# Patient Record
Sex: Female | Born: 1965
Health system: Southern US, Community
[De-identification: ages and names within clinical notes are randomized; demographics above are authoritative.]

## PROBLEM LIST (undated history)

## (undated) HISTORY — PX: DILATION AND CURETTAGE OF UTERUS: SHX78

---

## 1999-03-07 ENCOUNTER — Other Ambulatory Visit: Admission: RE | Admit: 1999-03-07 | Discharge: 1999-03-07 | Payer: Self-pay | Admitting: Obstetrics & Gynecology

## 1999-12-12 ENCOUNTER — Encounter: Payer: Self-pay | Admitting: Obstetrics & Gynecology

## 1999-12-12 ENCOUNTER — Ambulatory Visit (HOSPITAL_COMMUNITY): Admission: RE | Admit: 1999-12-12 | Discharge: 1999-12-12 | Payer: Self-pay | Admitting: Obstetrics & Gynecology

## 1999-12-20 ENCOUNTER — Ambulatory Visit (HOSPITAL_COMMUNITY): Admission: RE | Admit: 1999-12-20 | Discharge: 1999-12-20 | Payer: Self-pay | Admitting: Obstetrics & Gynecology

## 2000-04-03 ENCOUNTER — Other Ambulatory Visit: Admission: RE | Admit: 2000-04-03 | Discharge: 2000-04-03 | Payer: Self-pay | Admitting: Obstetrics & Gynecology

## 2000-09-12 ENCOUNTER — Ambulatory Visit (HOSPITAL_COMMUNITY): Admission: RE | Admit: 2000-09-12 | Discharge: 2000-09-12 | Payer: Self-pay | Admitting: Obstetrics & Gynecology

## 2000-11-16 ENCOUNTER — Inpatient Hospital Stay (HOSPITAL_COMMUNITY): Admission: AD | Admit: 2000-11-16 | Discharge: 2000-11-18 | Payer: Self-pay | Admitting: Obstetrics & Gynecology

## 2001-09-04 ENCOUNTER — Other Ambulatory Visit: Admission: RE | Admit: 2001-09-04 | Discharge: 2001-09-04 | Payer: Self-pay | Admitting: Obstetrics & Gynecology

## 2002-10-08 ENCOUNTER — Other Ambulatory Visit: Admission: RE | Admit: 2002-10-08 | Discharge: 2002-10-08 | Payer: Self-pay | Admitting: Obstetrics & Gynecology

## 2003-12-16 ENCOUNTER — Other Ambulatory Visit: Admission: RE | Admit: 2003-12-16 | Discharge: 2003-12-16 | Payer: Self-pay | Admitting: Obstetrics & Gynecology

## 2005-01-24 ENCOUNTER — Other Ambulatory Visit: Admission: RE | Admit: 2005-01-24 | Discharge: 2005-01-24 | Payer: Self-pay | Admitting: Obstetrics & Gynecology

## 2011-03-17 ENCOUNTER — Ambulatory Visit (INDEPENDENT_AMBULATORY_CARE_PROVIDER_SITE_OTHER): Payer: 59

## 2011-03-17 DIAGNOSIS — F411 Generalized anxiety disorder: Secondary | ICD-10-CM

## 2011-03-17 DIAGNOSIS — L509 Urticaria, unspecified: Secondary | ICD-10-CM

## 2011-03-17 DIAGNOSIS — M79609 Pain in unspecified limb: Secondary | ICD-10-CM

## 2014-11-08 ENCOUNTER — Encounter (HOSPITAL_COMMUNITY): Payer: Self-pay

## 2014-12-11 ENCOUNTER — Ambulatory Visit (INDEPENDENT_AMBULATORY_CARE_PROVIDER_SITE_OTHER): Payer: 59 | Admitting: Family Medicine

## 2014-12-11 VITALS — BP 110/80 | HR 70 | Temp 98.4°F | Resp 12 | Ht 67.0 in | Wt 177.0 lb

## 2014-12-11 DIAGNOSIS — J0100 Acute maxillary sinusitis, unspecified: Secondary | ICD-10-CM

## 2014-12-11 MED ORDER — AMOXICILLIN 875 MG PO TABS: 875.0000 mg | ORAL_TABLET | Freq: Two times a day (BID) | ORAL | Status: DC

## 2014-12-11 NOTE — Progress Notes (Signed)
° °  Subjective:    Patient ID: Ann Cobb, female    DOB: [birth year], 49 y.o.   MRN: [id number] This chart was scribed for Robyn Haber, MD by Zola Button, Medical Scribe. This patient was seen in Room 1 and the patient's care was started at 6:07 PM.   HPI HPI Comments: Ann Cobb is a 49 y.o. female who presents to the [facility] complaining of gradual onset, intermittent right ear pain that started about a week and a half ago. Patient reports having associated fatigue and congestion. She also notes she had some dizziness yesterday. Patient denies fever and cough. NDKA.  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  Review of Systems  Constitutional: Positive for fatigue. Negative for fever.  HENT: Positive for congestion and ear pain.   Respiratory: Negative for cough.   Neurological: Positive for dizziness.       Objective:   Physical Exam CONSTITUTIONAL: Well developed/well nourished HEAD: Normocephalic/atraumatic EYES: EOM/PERRL ENMT: Mucous membranes moist; mucopurulent discharge was narrowed nasal passages, post pharyngeal white and yellow discharge NECK: supple no meningeal signs SPINE: entire spine nontender CV: S1/S2 noted, no murmurs/rubs/gallops noted LUNGS: Lungs are clear to auscultation bilaterally, no apparent distress GU: no cva tenderness NEURO: Pt is awake/alert, moves all extremitiesx4 EXTREMITIES: pulses normal, full ROM SKIN: warm, color normal PSYCH: no abnormalities of mood noted      Assessment & Plan:   By signing my name below, I, Zola Button, attest that this documentation has been prepared under the direction and in the presence of Robyn Haber, MD.  Electronically Signed: Zola Button, Medical Scribe. 12/11/2014. 6:02 PM. This chart was scribed in my presence and reviewed by me personally.    ICD-9-CM ICD-10-CM   1. Acute maxillary sinusitis, recurrence not specified 461.0 J01.00 amoxicillin (AMOXIL) 875 MG  tablet     Signed, Robyn Haber, MD

## 2014-12-11 NOTE — Patient Instructions (Signed)

## 2015-12-21 ENCOUNTER — Encounter: Payer: Self-pay | Admitting: Gastroenterology

## 2016-02-03 ENCOUNTER — Ambulatory Visit (AMBULATORY_SURGERY_CENTER): Payer: Self-pay | Admitting: *Deleted

## 2016-02-03 VITALS — Ht 67.5 in | Wt 174.0 lb

## 2016-02-03 DIAGNOSIS — Z1211 Encounter for screening for malignant neoplasm of colon: Secondary | ICD-10-CM

## 2016-02-03 MED ORDER — NA SULFATE-K SULFATE-MG SULF 17.5-3.13-1.6 GM/177ML PO SOLN
1.0000 | Freq: Once | ORAL | 0 refills | Status: AC
Start: 2016-02-03 — End: 2016-02-03

## 2016-02-03 NOTE — Progress Notes (Signed)
No egg or soy allergy. No anesthesia problems.  No home O2.  No diet meds.  

## 2016-02-04 ENCOUNTER — Encounter: Payer: Self-pay | Admitting: Gastroenterology

## 2016-02-17 ENCOUNTER — Encounter: Payer: Self-pay | Admitting: Gastroenterology

## 2016-02-17 ENCOUNTER — Ambulatory Visit (AMBULATORY_SURGERY_CENTER): Payer: 59 | Admitting: Gastroenterology

## 2016-02-17 VITALS — BP 119/61 | HR 69 | Temp 96.8°F | Resp 16 | Ht 67.5 in | Wt 174.0 lb

## 2016-02-17 DIAGNOSIS — D122 Benign neoplasm of ascending colon: Secondary | ICD-10-CM | POA: Diagnosis not present

## 2016-02-17 DIAGNOSIS — K635 Polyp of colon: Secondary | ICD-10-CM | POA: Diagnosis not present

## 2016-02-17 DIAGNOSIS — Z1211 Encounter for screening for malignant neoplasm of colon: Secondary | ICD-10-CM

## 2016-02-17 DIAGNOSIS — K621 Rectal polyp: Secondary | ICD-10-CM

## 2016-02-17 DIAGNOSIS — Z1212 Encounter for screening for malignant neoplasm of rectum: Secondary | ICD-10-CM

## 2016-02-17 DIAGNOSIS — D123 Benign neoplasm of transverse colon: Secondary | ICD-10-CM

## 2016-02-17 DIAGNOSIS — D127 Benign neoplasm of rectosigmoid junction: Secondary | ICD-10-CM

## 2016-02-17 MED ORDER — SODIUM CHLORIDE 0.9 % IV SOLN: 500.0000 mL | INTRAVENOUS | Status: DC

## 2016-02-17 NOTE — Progress Notes (Signed)
A and O x3. Report to RN. Tolerated MAC anesthesia well. 

## 2016-02-17 NOTE — Patient Instructions (Signed)
Discharge instructions given. Handout on polyps. No ibuprofen,naproxen,or other non-steroidal anti-inflammatory drugs for 2 weeks. Resume previous medications. YOU HAD AN ENDOSCOPIC PROCEDURE TODAY AT [facility]:   Refer to the procedure report that was given to you for any specific questions about what was found during the examination.  If the procedure report does not answer your questions, please call your gastroenterologist to clarify.  If you requested that your care partner not be given the details of your procedure findings, then the procedure report has been included in a sealed envelope for you to review at your convenience later.  YOU SHOULD EXPECT: Some feelings of bloating in the abdomen. Passage of more gas than usual.  Walking can help get rid of the air that was put into your GI tract during the procedure and reduce the bloating. If you had a lower endoscopy (such as a colonoscopy or flexible sigmoidoscopy) you may notice spotting of blood in your stool or on the toilet paper. If you underwent a bowel prep for your procedure, you may not have a normal bowel movement for a few days.  Please Note:  You might notice some irritation and congestion in your nose or some drainage.  This is from the oxygen used during your procedure.  There is no need for concern and it should clear up in a day or so.  SYMPTOMS TO REPORT IMMEDIATELY:   Following lower endoscopy (colonoscopy or flexible sigmoidoscopy):  Excessive amounts of blood in the stool  Significant tenderness or worsening of abdominal pains  Swelling of the abdomen that is new, acute  Fever of 100F or higher   For urgent or emergent issues, a gastroenterologist can be reached at any hour by calling [phone].   DIET:  We do recommend a small meal at first, but then you may proceed to your regular diet.  Drink plenty of fluids but you should avoid alcoholic beverages for 24 hours.  ACTIVITY:  You should  plan to take it easy for the rest of today and you should NOT DRIVE or use heavy machinery until tomorrow (because of the sedation medicines used during the test).    FOLLOW UP: Our staff will call the number listed on your records the next business day following your procedure to check on you and address any questions or concerns that you may have regarding the information given to you following your procedure. If we do not reach you, we will leave a message.  However, if you are feeling well and you are not experiencing any problems, there is no need to return our call.  We will assume that you have returned to your regular daily activities without incident.  If any biopsies were taken you will be contacted by phone or by letter within the next 1-3 weeks.  Please call us at [phone] if you have not heard about the biopsies in 3 weeks.    SIGNATURES/CONFIDENTIALITY: You and/or your care partner have signed paperwork which will be entered into your electronic medical record.  These signatures attest to the fact that that the information above on your After Visit Summary has been reviewed and is understood.  Full responsibility of the confidentiality of this discharge information lies with you and/or your care-partner.

## 2016-02-17 NOTE — Progress Notes (Signed)
Called to room to assist during endoscopic procedure.  Patient ID and intended procedure confirmed with present staff. Received instructions for my participation in the procedure from the performing physician.  

## 2016-02-17 NOTE — Op Note (Signed)
[place] Patient Name: Ann Cobb Procedure Date: 02/17/2016 1:56 PM MRN: [id number] Endoscopist: Remo Lipps P. Delray Reza MD, MD Age: 50 Referring MD:  Date of Birth: 02-15-1966 Gender: Female Account #: [id number] Procedure:                Colonoscopy Indications:              Screening for colorectal malignant neoplasm, This                            is the patient's first colonoscopy Medicines:                Monitored Anesthesia Care Procedure:                Pre-Anesthesia Assessment:                           - Prior to the procedure, a History and Physical                            was performed, and patient medications and                            allergies were reviewed. The patient's tolerance of                            previous anesthesia was also reviewed. The risks                            and benefits of the procedure and the sedation                            options and risks were discussed with the patient.                            All questions were answered, and informed consent                            was obtained. Prior Anticoagulants: The patient has                            taken no previous anticoagulant or antiplatelet                            agents. ASA Grade Assessment: II - A patient with                            mild systemic disease. After reviewing the risks                            and benefits, the patient was deemed in                            satisfactory condition to undergo the procedure.  After obtaining informed consent, the colonoscope                            was passed under direct vision. Throughout the                            procedure, the patient's blood pressure, pulse, and                            oxygen saturations were monitored continuously. The                            Model PCF-[id number]) scope was introduced                            through the  anus and advanced to the the cecum,                            identified by appendiceal orifice and ileocecal                            valve. The colonoscopy was performed without                            difficulty. The patient tolerated the procedure                            well. The quality of the bowel preparation was                            good. The ileocecal valve, appendiceal orifice, and                            rectum were photographed. Scope In: 2:10:15 PM Scope Out: 2:29:40 PM Scope Withdrawal Time: 0 hours 14 minutes 41 seconds  Total Procedure Duration: 0 hours 19 minutes 25 seconds  Findings:                 The perianal and digital rectal examinations were                            normal.                           A diminutive polyp was found in the ascending                            colon. The polyp was sessile. The polyp was removed                            with a cold biopsy forceps. Resection and retrieval                            were complete.  A 5 mm polyp was found in the splenic flexure. The                            polyp was sessile. The polyp was removed with a                            cold snare. Resection and retrieval were complete.                           A 5 mm polyp was found in the recto-sigmoid colon.                            The polyp was sessile. The polyp was removed with a                            cold snare. Resection and retrieval were complete.                           The colon was tortuous.                           The exam was otherwise without abnormality. Complications:            No immediate complications. Estimated blood loss:                            Minimal. Estimated Blood Loss:     Estimated blood loss was minimal. Impression:               - One diminutive polyp in the ascending colon,                            removed with a cold biopsy forceps. Resected and                             retrieved.                           - One 5 mm polyp at the splenic flexure, removed                            with a cold snare. Resected and retrieved.                           - One 5 mm polyp at the recto-sigmoid colon,                            removed with a cold snare. Resected and retrieved.                           - Tortuous colon.                           - The examination was otherwise normal. Recommendation:           -  Patient has a contact number available for                            emergencies. The signs and symptoms of potential                            delayed complications were discussed with the                            patient. Return to normal activities tomorrow.                            Written discharge instructions were provided to the                            patient.                           - Resume previous diet.                           - Continue present medications.                           - No ibuprofen, naproxen, or other non-steroidal                            anti-inflammatory drugs for 2 weeks after polyp                            removal.                           - Await pathology results.                           - Repeat colonoscopy is recommended for                            surveillance. The colonoscopy date will be                            determined after pathology results from today's                            exam become available for review. Remo Lipps P. Tamre Cass MD, MD 02/17/2016 2:33:13 PM This report has been signed electronically.

## 2016-02-18 ENCOUNTER — Telehealth: Payer: Self-pay

## 2016-02-18 NOTE — Telephone Encounter (Signed)
  Follow up Call-  Call back number 02/17/2016  Post procedure Call Back phone  # [phone]  Permission to leave phone message Yes  Some recent data might be hidden     Patient questions:  Do you have a fever, pain , or abdominal swelling? No. Pain Score  0 *  Have you tolerated food without any problems? Yes.    Have you been able to return to your normal activities? Yes.    Do you have any questions about your discharge instructions: Diet   No. Medications  No. Follow up visit  No.  Do you have questions or concerns about your Care? No.  Actions: * If pain score is 4 or above: No action needed, pain <4.

## 2016-02-29 ENCOUNTER — Encounter: Payer: Self-pay | Admitting: Gastroenterology

## 2016-06-08 ENCOUNTER — Ambulatory Visit: Payer: 59 | Admitting: Podiatry

## 2016-07-13 ENCOUNTER — Encounter (HOSPITAL_COMMUNITY): Payer: Self-pay | Admitting: Emergency Medicine

## 2016-07-13 DIAGNOSIS — M222X1 Patellofemoral disorders, right knee: Secondary | ICD-10-CM | POA: Diagnosis not present

## 2016-07-13 DIAGNOSIS — M25561 Pain in right knee: Secondary | ICD-10-CM | POA: Insufficient documentation

## 2017-01-01 DIAGNOSIS — Z01419 Encounter for gynecological examination (general) (routine) without abnormal findings: Secondary | ICD-10-CM | POA: Diagnosis not present

## 2017-01-01 DIAGNOSIS — Z1231 Encounter for screening mammogram for malignant neoplasm of breast: Secondary | ICD-10-CM | POA: Diagnosis not present

## 2017-01-01 DIAGNOSIS — Z0000 Encounter for general adult medical examination without abnormal findings: Secondary | ICD-10-CM | POA: Diagnosis not present

## 2017-05-21 DIAGNOSIS — H2513 Age-related nuclear cataract, bilateral: Secondary | ICD-10-CM | POA: Diagnosis not present

## 2017-05-21 DIAGNOSIS — H25013 Cortical age-related cataract, bilateral: Secondary | ICD-10-CM | POA: Diagnosis not present

## 2017-05-21 DIAGNOSIS — H43393 Other vitreous opacities, bilateral: Secondary | ICD-10-CM | POA: Diagnosis not present

## 2017-09-13 ENCOUNTER — Encounter: Payer: Self-pay | Admitting: Family Medicine

## 2017-09-13 ENCOUNTER — Ambulatory Visit: Payer: 59 | Admitting: Family Medicine

## 2017-09-13 VITALS — BP 116/79 | HR 62 | Ht 67.0 in | Wt 188.8 lb

## 2017-09-13 DIAGNOSIS — Z713 Dietary counseling and surveillance: Secondary | ICD-10-CM | POA: Diagnosis not present

## 2017-09-13 DIAGNOSIS — E663 Overweight: Secondary | ICD-10-CM | POA: Diagnosis not present

## 2017-09-13 DIAGNOSIS — Z803 Family history of malignant neoplasm of breast: Secondary | ICD-10-CM | POA: Diagnosis not present

## 2017-09-13 NOTE — Patient Instructions (Addendum)
Goals for next time in approximately 4 weeks is to track everything that passes your lips into the lose it app or my fitness pal app.  Important you bring in your phone and/or tablet next office visit so we can go over it.    Please realize, EXERCISE IS MEDICINE!  -  American Heart Association [facility]) guidelines for exercise : If you are in good health, without any medical conditions, you should engage in 150 minutes of moderate intensity aerobic activity per week.  This means you should be huffing and puffing throughout your workout.   Engaging in regular exercise will improve brain function and memory, as well as improve mood, boost immune system and help with weight management.  As well as the other, more well-known effects of exercise such as decreasing blood sugar levels, decreasing blood pressure,  and decreasing bad cholesterol levels/ increasing good cholesterol levels.     -  The AHA strongly endorses consumption of a diet that contains a variety of foods from all the food categories with an emphasis on fruits and vegetables; fat-free and low-fat dairy products; cereal and grain products; legumes and nuts; and fish, poultry, and/or extra lean meats.    Excessive food intake, especially of foods high in saturated and trans fats, sugar, and salt, should be avoided.    Adequate water intake of roughly 1/2 of your weight in pounds, should equal the ounces of water per day you should drink.  So for instance, if you're 200 pounds, that would be 100 ounces of water per day.   Mediterranean Diet  Why follow it? Research shows  Those who follow the Mediterranean diet have a reduced risk of heart disease   The diet is associated with a reduced incidence of Parkinson's and Alzheimer's diseases  People following the diet may have longer life expectancies and lower rates of chronic diseases   The Dietary Guidelines for Americans recommends the Mediterranean diet as an eating plan to promote health  and prevent disease  What Is the Mediterranean Diet?   Healthy eating plan based on typical foods and recipes of Mediterranean-style cooking  The diet is primarily a plant based diet; these foods should make up a majority of meals   Starches - Plant based foods should make up a majority of meals - They are an important sources of vitamins, minerals, energy, antioxidants, and fiber - Choose whole grains, foods high in fiber and minimally processed items  - Typical grain sources include wheat, oats, barley, corn, brown rice, bulgar, farro, millet, polenta, couscous  - Various types of beans include chickpeas, lentils, fava beans, black beans, white beans   Fruits  Veggies - Large quantities of antioxidant rich fruits & veggies; 6 or more servings  - Vegetables can be eaten raw or lightly drizzled with oil and cooked  - Vegetables common to the traditional Mediterranean Diet include: artichokes, arugula, beets, broccoli, brussel sprouts, cabbage, carrots, celery, collard greens, cucumbers, eggplant, kale, leeks, lemons, lettuce, mushrooms, okra, onions, peas, peppers, potatoes, pumpkin, radishes, rutabaga, shallots, spinach, sweet potatoes, turnips, zucchini - Fruits common to the Mediterranean Diet include: apples, apricots, avocados, cherries, clementines, dates, figs, grapefruits, grapes, melons, nectarines, oranges, peaches, pears, pomegranates, strawberries, tangerines  Fats - Replace butter and margarine with healthy oils, such as olive oil, canola oil, and tahini  - Limit nuts to no more than a handful a day  - Nuts include walnuts, almonds, pecans, pistachios, pine nuts  - Limit or avoid candied, honey  roasted or heavily salted nuts - Olives are central to the Mediterranean diet - can be eaten whole or used in a variety of dishes   Meats Protein - Limiting red meat: no more than a few times a month - When eating red meat: choose lean cuts and keep the portion to the size of deck of  cards - Eggs: approx. 0 to 4 times a week  - Fish and lean poultry: at least 2 a week  - Healthy protein sources include, chicken, [place], lean beef, lamb - Increase intake of seafood such as tuna, salmon, trout, mackerel, shrimp, scallops - Avoid or limit high fat processed meats such as sausage and bacon  Dairy - Include moderate amounts of low fat dairy products  - Focus on healthy dairy such as fat free yogurt, skim milk, low or reduced fat cheese - Limit dairy products higher in fat such as whole or 2% milk, cheese, ice cream  Alcohol - Moderate amounts of red wine is ok  - No more than 5 oz daily for women (all ages) and men older than age 76  - No more than 10 oz of wine daily for men younger than 26  Other - Limit sweets and other desserts  - Use herbs and spices instead of salt to flavor foods  - Herbs and spices common to the traditional Mediterranean Diet include: basil, bay leaves, chives, cloves, cumin, fennel, garlic, lavender, marjoram, mint, oregano, parsley, pepper, rosemary, sage, savory, sumac, tarragon, thyme   Its not just a diet, its a lifestyle:   The Mediterranean diet includes lifestyle factors typical of those in the region   Foods, drinks and meals are best eaten with others and savored  Daily physical activity is important for overall good health  This could be strenuous exercise like running and aerobics  This could also be more leisurely activities such as walking, housework, yard-work, or taking the stairs  Moderation is the key; a balanced and healthy diet accommodates most foods and drinks  Consider portion sizes and frequency of consumption of certain foods   Meal Ideas & Options:   Breakfast:  o Whole wheat toast or whole wheat English muffins with peanut butter & hard boiled egg o Steel cut oats topped with apples & cinnamon and skim milk  o Fresh fruit: banana, strawberries, melon, berries, peaches  o Smoothies: strawberries, bananas, greek  yogurt, peanut butter o Low fat greek yogurt with blueberries and granola  o Egg white omelet with spinach and mushrooms o Breakfast couscous: whole wheat couscous, apricots, skim milk, cranberries   Sandwiches:  o Hummus and grilled vegetables (peppers, zucchini, squash) on whole wheat bread   o Grilled chicken on whole wheat pita with lettuce, tomatoes, cucumbers or tzatziki  o Tuna salad on whole wheat bread: tuna salad made with greek yogurt, olives, red peppers, capers, green onions o Garlic rosemary lamb pita: lamb sauted with garlic, rosemary, salt & pepper; add lettuce, cucumber, greek yogurt to pita - flavor with lemon juice and black pepper   Seafood:  o Mediterranean grilled salmon, seasoned with garlic, basil, parsley, lemon juice and black pepper o Shrimp, lemon, and spinach whole-grain pasta salad made with low fat greek yogurt  o Seared scallops with lemon orzo  o Seared tuna steaks seasoned salt, pepper, coriander topped with tomato mixture of olives, tomatoes, olive oil, minced garlic, parsley, green onions and cappers   Meats:  o Herbed greek chicken salad with kalamata olives, cucumber, feta  o Red bell peppers stuffed with spinach, bulgur, lean ground beef (or lentils) & topped with feta   o Kebabs: skewers of chicken, tomatoes, onions, zucchini, squash  o [place] burgers: made with red onions, mint, dill, lemon juice, feta cheese topped with roasted red peppers  Vegetarian o Cucumber salad: cucumbers, artichoke hearts, celery, red onion, feta cheese, tossed in olive oil & lemon juice  o Hummus and whole grain pita points with a greek salad (lettuce, tomato, feta, olives, cucumbers, red onion) o Lentil soup with celery, carrots made with vegetable broth, garlic, salt and pepper  o Tabouli salad: parsley, bulgur, mint, scallions, cucumbers, tomato, radishes, lemon juice, olive oil, salt and pepper.    Behavior Modification Ideas for Weight Management  Weight  management involves adopting a healthy lifestyle that includes a knowledge of nutrition and exercise, a positive attitude and the right kind of motivation. Internal motives such as better health, increased energy, self-esteem and personal control increase your chances of lifelong weight management success.  Remember to have realistic goals and think long-term success. Believe in yourself and you can do it. The following information will give you ideas to help you meet your goals.  Control Your Home Environment  Eat only while sitting down at the kitchen or dining room table. Do not eat while watching television, reading, cooking, talking on the phone, standing at the refrigerator or working on the computer. Keep tempting foods out of the house -- don't buy them. Keep tempting foods out of sight. Have low-calorie foods ready to eat. Unless you are preparing a meal, stay out of the kitchen. Have healthy snacks at your disposal, such as small pieces of fruit, vegetables, canned fruit, pretzels, low-fat string cheese and nonfat cottage cheese.  Control Your Work Environment  Do not eat at [employer] or keep tempting snacks at your desk. If you get hungry between meals, plan healthy snacks and bring them with you to work. During your breaks, go for a walk instead of eating. If you work around food, plan in advance the one item you will eat at mealtime. Make it inconvenient to nibble on food by chewing gum, sugarless candy or drinking water or another low-calorie beverage. Do not work through meals. Skipping meals slows down metabolism and may result in overeating at the next meal. If food is available for special occasions, either pick the healthiest item, nibble on low-fat snacks brought from home, don't have anything offered, choose one option and have a small amount, or have only a beverage.  Control Your Mealtime Environment  Serve your plate of food at the stove or kitchen counter. Do not put  the serving dishes on the table. If you do put dishes on the table, remove them immediately when finished eating. Fill half of your plate with vegetables, a quarter with lean protein and a quarter with starch. Use smaller plates, bowls and glasses. A smaller portion will look large when it is in a little dish. Politely refuse second helpings. When fixing your plate, limit portions of food to one scoop/serving or less.   Daily Food Management  Replace eating with another activity that you will not associate with food. Wait 20 minutes before eating something you are craving. Drink a large glass of water or diet soda before eating. Always have a big glass or bottle of water to drink throughout the day. Avoid high-calorie add-ons such as cream with your coffee, butter, mayonnaise and salad dressings.  Shopping: Do not shop  when hungry or tired. Shop from a list and avoid buying anything that is not on your list. If you must have tempting foods, buy individual-sized packages and try to find a lower-calorie alternative. Don't taste test in the store. Read food labels. Compare products to help you make the healthiest choices.  Preparation: Chew a piece of gum while cooking meals. Use a quarter teaspoon if you taste test your food. Try to only fix what you are going to eat, leaving yourself no chance for seconds. If you have prepared more food than you need, portion it into individual containers and freeze or refrigerate immediately. Don't snack while cooking meals.  Eating: Eat slowly. Remember it takes about 20 minutes for your stomach to send a message to your brain that it is full. Don't let fake hunger make you think you need more. The ideal way to eat is to take a bite, put your utensil down, take a sip of water, cut your next bite, take a bit, put your utensil down and so on. Do not cut your food all at one time. Cut only as needed. Take small bites and chew your food well. Stop  eating for a minute or two at least once during a meal or snack. Take breaks to reflect and have conversation.  Cleanup and Leftovers: Label leftovers for a specific meal or snack. Freeze or refrigerate individual portions of leftovers. Do not clean up if you are still hungry.  Eating Out and Social Eating  Do not arrive hungry. Eat something light before the meal. Try to fill up on low-calorie foods, such as vegetables and fruit, and eat smaller portions of the high-calorie foods. Eat foods that you like, but choose small portions. If you want seconds, wait at least 20 minutes after you have eaten to see if you are actually hungry or if your eyes are bigger than your stomach. Limit alcoholic beverages. Try a soda water with a twist of lime. Do not skip other meals in the day to save room for the special event.  At Restaurants: Order  la carte rather than buffet style. Order some vegetables or a salad for an appetizer instead of eating bread. If you order a high-calorie dish, share it with someone. Try an after-dinner mint with your coffee. If you do have dessert, share it with two or more people. Don't overeat because you do not want to waste food. Ask for a doggie bag to take extra food home. Tell the server to put half of your entree in a to go bag before the meal is served to you. Ask for salad dressing, gravy or high-fat sauces on the side. Dip the tip of your fork in the dressing before each bite. If bread is served, ask for only one piece. Try it plain without butter or oil. At [employer] where oil and vinegar is served with bread, use only a small amount of oil and a lot of vinegar for dipping.  At a Friend's House: Offer to bring a dish, appetizer or dessert that is low in calories. Serve yourself small portions or tell the host that you only want a small amount. Stand or sit away from the snack table. Stay away from the kitchen or stay busy if you are near the  food. Limit your alcohol intake.  At [employer] and Cafeterias: Cover most of your plate with lettuce and/or vegetables. Use a salad plate instead of a dinner plate. After eating, clear away your dishes  before having coffee or tea.  Entertaining at Home: Explore low-fat, low-cholesterol cookbooks. Use single-serving foods like chicken breasts or hamburger patties. Prepare low-calorie appetizers and desserts.   Holidays: Keep tempting foods out of sight. Decorate the house without using food. Have low-calorie beverages and foods on hand for guests. Allow yourself one planned treat a day. Don't skip meals to save up for the holiday feast. Eat regular, planned meals.   Exercise Well  Make exercise a priority and a planned activity in the day. If possible, walk the entire or part of the distance to work. Get an exercise buddy. Go for a walk with a colleague during one of your breaks, go to the gym, run or take a walk with a friend, walk in the mall with a shopping companion. Park at the end of the parking lot and walk to the store or office entrance. Always take the stairs all of the way or at least part of the way to your floor. If you have a desk job, walk around the office frequently. Do leg lifts while sitting at your desk. Do something outside on the weekends like going for a hike or a bike ride.   Have a Healthy Attitude  Make health your weight management priority. Be realistic. Have a goal to achieve a healthier you, not necessarily the lowest weight or ideal weight based on calculations or tables. Focus on a healthy eating style, not on dieting. Dieting usually lasts for a short amount of time and rarely produces long-term success. Think long term. You are developing new healthy behaviors to follow next month, in a year and in a decade.    This information is for educational purposes only and is not intended to replace the advice of your doctor or health care provider. We  encourage you to discuss with your doctor any questions or concerns you may have.        Guidelines for Losing Weight   We want weight loss that will last so you should lose 1-2 pounds a week.  THAT IS IT! Please pick THREE things a month to change. Once it is a habit check off the item. Then pick another three items off the list to become habits.  If you are already doing a habit on the list GREAT!  Cross that item off!  Dont drink your calories. Ie, alcohol, soda, fruit juice, and sweet tea.   Drink more water. Drink a glass when you feel hungry or before each meal.   Eat breakfast - Complex carb and protein (likeDannon light and fit yogurt, oatmeal, fruit, eggs, [place] bacon).  Measure your cereal.  Eat no more than one cup a day. (ie Kashi)  Eat an apple a day.  Add a vegetable a day.  Try a new vegetable a month.  Use Pam! Stop using oil or butter to cook.  Dont finish your plate or use smaller plates.  Share your dessert.  Eat sugar free Jello for dessert or frozen grapes.  Dont eat 2-3 hours before bed.  Switch to whole wheat bread, pasta, and brown rice.  Make healthier choices when you eat out. No fries!  Pick baked chicken, NOT fried.  Dont forget to SLOW DOWN when you eat. It is not going anywhere.   Take the stairs.  Park far away in the parking lot  Lift soup cans (or weights) for 10 minutes while watching TV.  Walk at work for 10 minutes during break.  Walk outside  1 time a week with your friend, kids, dog, or significant other.  Start a walking group at church.  Walk the mall as much as you can tolerate.   Keep a food diary.  Weigh yourself daily.  Walk for 15 minutes 3 days per week.  Cook at home more often and eat out less. If life happens and you go back to old habits, it is okay.  Just start over. You can do it!  If you experience chest pain, get short of breath, or tired during the exercise, please stop immediately and inform  your doctor.    Before you even begin to attack a weight-loss plan, it pays to remember this: You are not fat. You have fat. Losing weight isn't about blame or shame; it's simply another achievement to accomplish. Dieting is like any other skill--you have to buckle down and work at it. As long as you act in a smart, reasonable way, you'll ultimately get where you want to be. Here are some weight loss pearls for you.   1. It's Not a Diet. It's a Lifestyle Thinking of a diet as something you're on and suffering through only for the short term doesn't work. To shed weight and keep it off, you need to make permanent changes to the way you eat. It's OK to indulge occasionally, of course, but if you cut calories temporarily and then revert to your old way of eating, you'll gain back the weight quicker than you can say yo-yo. Use it to lose it. Research shows that one of the best predictors of long-term weight loss is how many pounds you drop in the first month. For that reason, nutritionists often suggest being stricter for the first two weeks of your new eating strategy to build momentum. Cut out added sugar and alcohol and avoid unrefined carbs. After that, figure out how you can reincorporate them in a way that's healthy and maintainable.  2. There's a Right Way to Exercise Working out burns calories and fat and boosts your metabolism by building muscle. But those trying to lose weight are notorious for overestimating the number of calories they burn and underestimating the amount they take in. Unfortunately, your system is biologically programmed to hold on to extra pounds and that means when you start exercising, your body senses the deficit and ramps up its hunger signals. If you're not diligent, you'll eat everything you burn and then some. Use it, to lose it. Cardio gets all the exercise glory, but strength and interval training are the real heroes. They help you build lean muscle, which in turn  increases your metabolism and calorie-burning ability 3. Don't Overreact to Mild Hunger Some people have a hard time losing weight because of hunger anxiety. To them, being hungry is bad--something to be avoided at all costs--so they carry snacks with them and eat when they don't need to. Others eat because they're stressed out or bored. While you never want to get to the point of being ravenous (that's when bingeing is likely to happen), a hunger pang, a craving, or the fact that it's 3:00 p.m. should not send you racing for the vending machine or obsessing about the energy bar in your purse. Ideally, you should put off eating until your stomach is growling and it's difficult to concentrate.  Use it to lose it. When you feel the urge to eat, use the HALT method. Ask yourself, Am I really hungry? Or am I angry or anxious, lonely or  bored, or tired? If you're still not certain, try the apple test. If you're truly hungry, an apple should seem delicious; if it doesn't, something else is going on. Or you can try drinking water and making yourself busy, if you are still hungry try a healthy snack.  4. Not All Calories Are Created Equal The mechanics of weight loss are pretty simple: Take in fewer calories than you use for energy. But the kind of food you eat makes all the difference. Processed food that's high in saturated fat and refined starch or sugar can cause inflammation that disrupts the hormone signals that tell your brain you're full. The result: You eat a lot more.  Use it to lose it. Clean up your diet. Swap in whole, unprocessed foods, including vegetables, lean protein, and healthy fats that will fill you up and give you the biggest nutritional bang for your calorie buck. In a few weeks, as your brain starts receiving regular hunger and fullness signals once again, you'll notice that you feel less hungry overall and naturally start cutting back on the amount you eat.  5. Protein, Produce, and  Plant-Based Fats Are Your Weight-Loss Trinity Here's why eating the three Ps regularly will help you drop pounds. Protein fills you up. You need it to build lean muscle, which keeps your metabolism humming so that you can torch more fat. People in a weight-loss program who ate double the recommended daily allowance for protein (about 110 grams for a 150-pound woman) lost 70 percent of their weight from fat, while people who ate the RDA lost only about 40 percent, one study found. Produce is packed with filling fiber. "It's very difficult to consume too many calories if you're eating a lot of vegetables. Example: Three cups of broccoli is a lot of food, yet only 93 calories. (Fruit is another story. It can be easy to overeat and can contain a lot of calories from sugar, so be sure to monitor your intake.) Plant-based fats like olive oil and those in avocados and nuts are healthy and extra satiating.  Use it to lose it. Aim to incorporate each of the three Ps into every meal and snack. People who eat protein throughout the day are able to keep weight off, according to a study in the [place] of Clinical Nutrition. In addition to meat, poultry and seafood, good sources are beans, lentils, eggs, tofu, and yogurt. As for fat, keep portion sizes in check by measuring out salad dressing, oil, and nut butters (shoot for one to two tablespoons). Finally, eat veggies or a little fruit at every meal. People who did that consumed 308 fewer calories but didn't feel any hungrier than when they didn't eat more produce.  7. How You Eat Is As Important As What You Eat In order for your brain to register that you're full, you need to focus on what you're eating. Sit down whenever you eat, preferably at a table. Turn off the TV or computer, put down your phone, and look at your food. Smell it. Chew slowly, and don't put another bite on your fork until you swallow. When women ate lunch this attentively, they consumed 30  percent less when snacking later than those who listened to an audiobook at lunchtime, according to a study in the [place] of Nutrition. 8. Weighing Yourself Really Works The scale provides the best evidence about whether your efforts are paying off. Seeing the numbers tick up or down or stagnate is motivation  to keep going--or to rethink your approach. A 2015 study at [facility] found that daily weigh-ins helped people lose more weight, keep it off, and maintain that loss, even after two years. Use it to lose it. Step on the scale at the same time every day for the best results. If your weight shoots up several pounds from one weigh-in to the next, don't freak out. Eating a lot of salt the night before or having your period is the likely culprit. The number should return to normal in a day or two. It's a steady climb that you need to do something about. 9. Too Much Stress and Too Little Sleep Are Your Enemies When you're tired and frazzled, your body cranks up the production of cortisol, the stress hormone that can cause carb cravings. Not getting enough sleep also boosts your levels of ghrelin, a hormone associated with hunger, while suppressing leptin, a hormone that signals fullness and satiety. People on a diet who slept only five and a half hours a night for two weeks lost 55 percent less fat and were hungrier than those who slept eight and a half hours, according to a study in the [place]. Use it to lose it. Prioritize sleep, aiming for seven hours or more a night, which research shows helps lower stress. And make sure you're getting quality zzz's. If a snoring spouse or a fidgety cat wakes you up frequently throughout the night, you may end up getting the equivalent of just four hours of sleep, according to a study from [facility]. Keep pets out of the bedroom, and use a white-noise app to drown out snoring. 10. You Will Hit a plateau--And You Can  Bust Through It As you slim down, your body releases much less leptin, the fullness hormone.  If you're not strength training, start right now. Building muscle can raise your metabolism to help you overcome a plateau. To keep your body challenged and burning calories, incorporate new moves and more intense intervals into your workouts or add another sweat session to your weekly routine. Alternatively, cut an extra 100 calories or so a day from your diet. Now that you've lost weight, your body simply doesn't need as much fuel.    Since food equals calories, in order to lose weight you must either eat fewer calories, exercise more to burn off calories with activity, or both. Food that is not used to fuel the body is stored as fat. A major component of losing weight is to make smarter food choices. Here's how:  1)   Limit non-nutritious foods, such as: Sugar, honey, syrups and candy Pastries, donuts, pies, cakes and cookies Soft drinks, sweetened juices and alcoholic beverages  2)  Cut down on high-fat foods by: - Choosing poultry, fish or lean red meat - Choosing low-fat cooking methods, such as baking, broiling, steaming, grilling and boiling - Using low-fat or non-fat dairy products - Using vinaigrette, herbs, lemon or fat-free salad dressings - Avoiding fatty meats, such as bacon, sausage, franks, ribs and luncheon meats - Avoiding high-fat snacks like nuts, chips and chocolate - Avoiding fried foods - Using less butter, margarine, oil and mayonnaise - Avoiding high-fat gravies, cream sauces and cream-based soups  3) Eat a variety of foods, including: - Fruit and vegetables that are raw, steamed or baked - Whole grains, breads, cereal, rice and pasta - Dairy products, such as low-fat or non-fat milk or yogurt, low-fat cottage cheese and low-fat cheese -  Protein-rich foods like chicken, [place], fish, lean meat and legumes, or beans  4) Change your eating habits by: - Eat three balanced  meals a day to help control your hunger - Watch portion sizes and eat small servings of a variety of foods - Choose low-calorie snacks - Eat only when you are hungry and stop when you are satisfied - Eat slowly and try not to perform other tasks while eating - Find other activities to distract you from food, such as walking, taking up a hobby or being involved in the community - Include regular exercise in your daily routine ( minimum of 20 min of moderate-intensity exercise at least 5 days/week)  - Find a support group, if necessary, for emotional support in your weight loss journey       Easy ways to cut 100 calories   1. Eat your eggs with hot sauce OR salsa instead of cheese.  Eggs are great for breakfast, but many people consider eggs and cheese to be BFFs. Instead of cheese--1 oz. of cheddar has 114 calories--top your eggs with hot sauce, which contains no calories and helps with satiety and metabolism. Salsa is also a great option!!  2. Top your toast, waffles or pancakes with fresh berries instead of jelly or syrup. Half a cup of berries--fresh, frozen or thawed--has about 40 calories, compared with 2 tbsp. of maple syrup or jelly, which both have about 100 calories. The berries will also give you a good punch of fiber, which helps keep you full and satisfied and wont spike blood sugar quickly like the jelly or syrup. 3. Swap the non-fat latte for black coffee with a splash of half-and-half. Contrary to its name, that non-fat latte has 130 calories and a startling 19g of carbohydrates per 16 oz. serving. Replacing that light drinkable dessert with a black coffee with a splash of half-and-half saves you more than 100 calories per 16 oz. serving. 4. Sprinkle salads with freeze-dried raspberries instead of dried cranberries. If you want a sweet addition to your nutritious salad, stay away from dried cranberries. They have a whopping 130 calories per  cup and 30g carbohydrates.  Instead, sprinkle freeze-dried raspberries guilt-free and save more than 100 calories per  cup serving, adding 3g of belly-filling fiber. 5. Go for mustard in place of mayo on your sandwich. Mustard can add really nice flavor to any sandwich, and there are tons of varieties, from spicy to honey. A serving of mayo is 95 calories, versus 10 calories in a serving of mustard.  Or try an avocado mayo spread: You can find the recipe few click this link: https://www.californiaavocado.com/recipes/recipe-container/[place]-avocado-mayo 6. Choose a DIY salad dressing instead of the store-bought kind. Mix Dijon or whole grain mustard with low-fat Kefir or red wine vinegar and garlic. 7. Use hummus as a spread instead of a dip. Use hummus as a spread on a high-fiber cracker or tortilla with a sandwich and save on calories without sacrificing taste. 8. Pick just one salad accessory. Salad isnt automatically a calorie winner. Its easy to over-accessorize with toppings. Instead of topping your salad with nuts, avocado and cranberries (all three will clock in at 313 calories), just pick one. The next day, choose a different accessory, which will also keep your salad interesting. You dont wear all your jewelry every day, right? 9. Ditch the white pasta in favor of spaghetti squash. One cup of cooked spaghetti squash has about 40 calories, compared with traditional spaghetti, which comes with more than  200. Spaghetti squash is also nutrient-dense. Its a good source of fiber and Vitamins A and C, and it can be eaten just like you would eat pasta--with a great tomato sauce and [place] meatballs or with pesto, tofu and spinach, for example. 10. Dress up your chili, soups and stews with non-fat [place] yogurt instead of sour cream. Just a dollop of sour cream can set you back 115 calories and a whopping 12g of fat--seven of which are of the artery-clogging variety. Added bonus: [place] yogurt is packed with  muscle-building protein, calcium and B Vitamins. 11. Mash cauliflower instead of mashed potatoes. One cup of traditional mashed potatoes--in all their creamy goodness--has more than 200 calories, compared to mashed cauliflower, which you can typically eat for less than 100 calories per 1 cup serving. Cauliflower is a great source of the antioxidant indole-3-carbinol (I3C), which may help reduce the risk of some cancers, like breast cancer. 12. Ditch the ice cream sundae in favor of a [place] yogurt parfait. Instead of a cup of ice cream or fro-yo for dessert, try 1 cup of nonfat Greek yogurt topped with fresh berries and a sprinkle of cacao nibs. Both toppings are packed with antioxidants, which can help reduce cellular inflammation and oxidative damage. And the comparison is a no-brainer: One cup of ice cream has about 275 calories; one cup of frozen yogurt has about 230; and a cup of Greek yogurt has just 130, plus twice the protein, so youre less likely to return to the freezer for a second helping. 13. Put olive oil in a spray container instead of using it directly from the bottle. Each tablespoon of olive oil is 120 calories and 15g of fat. Use a mister instead of pouring it straight into the pan or onto a salad. This allows for portion control and will save you more than 100 calories. 14. When baking, substitute canned pumpkin for butter or oil. Canned pumpkin--not pumpkin pie mix--is loaded with Vitamin A, which is important for skin and eye health, as well as immunity. And the comparisons are pretty crazy:  cup of canned pumpkin has about 40 calories, compared to butter or oil, which has more than 800 calories. Yes, 800 calories. Applesauce and mashed banana can also serve as good substitutions for butter or oil, usually in a 1:1 ratio. 15. Top casseroles with high-fiber cereal instead of breadcrumbs. Breadcrumbs are typically made with white bread, while breakfast cereals contain 5-9g of fiber per  serving. Not only will you save more than 150 calories per  cup serving, the swap will also keep you more full and youll get a metabolism boost from the added fiber. 16. Snack on pistachios instead of macadamia nuts. Believe it or not, you get the same amount of calories from 35 pistachios (100 calories) as you would from only five macadamia nuts. 17. Chow down on kale chips rather than potato chips. This is my favorite dont knock it till you try it swap. Kale chips are so easy to make at home, and you can spice them up with a little grated parmesan or chili powder. Plus, theyre a mere fraction of the calories of potato chips, but with the same crunch factor we crave so often. 18. Add seltzer and some fruit slices to your cocktail instead of soda or fruit juice. One cup of soda or fruit juice can pack on as much as 140 calories. Instead, use seltzer and fruit slices. The fruit provides valuable phytochemicals, such as flavonoids and  anthocyanins, which help to combat cancer and stave off the aging process.

## 2017-09-13 NOTE — Progress Notes (Signed)
New patient office visit note:  Impression and Recommendations:    1. Family history of breast cancer- Mom age 53; BRCA Neg   2. Overweight (BMI 25.0-29.9)   3. Encounter for weight loss counseling      1. BMI:  Explained to patient what BMI refers to, and what it means medically.    Told patient to think about it as a "medical risk stratification measurement" and how increasing BMI is associated with increasing risk/ or worsening state of various diseases such as hypertension, hyperlipidemia, diabetes, premature OA, depression etc.  American Heart Association guidelines for healthy diet, basically Mediterranean diet, and exercise guidelines of 30 minutes 5 days per week or more discussed in detail.  Health counseling performed.  All questions answered.  -Discussed with the patient regarding tracking her daily food intake via apps and to bring in that information on her next visit.    Education and routine counseling performed. Handouts provided.  No orders of the defined types were placed in this encounter.   No orders of the defined types were placed in this encounter.   Gross side effects, risk and benefits, and alternatives of medications discussed with patient.  Patient is aware that all medications have potential side effects and we are unable to predict every side effect or drug-drug interaction that may occur.  Expresses verbal understanding and consents to current therapy plan and treatment regimen.  Return in about 1 month (around 10/11/2017) for Follow-up 4 weeks for weight loss counseling; then near future for CPE\yearly physical with bldwrk.  Please see AVS handed out to patient at the end of our visit for further patient instructions/ counseling done pertaining to today's office visit.    Note: This document was prepared using Dragon voice recognition software and may include unintentional dictation errors.  This document serves as a record of services  personally performed by Mellody Dance, DO. It was created on her behalf by Steva Colder, a trained medical scribe. The creation of this record is based on the scribe's personal observations and the provider's statements to them.   I have reviewed the above medical documentation for accuracy and completeness and I concur.  Mellody Dance 09/23/17 9:29 PM   ----------------------------------------------------------------------------------------------------------------------    Subjective:    Chief complaint:   Chief Complaint  Patient presents with  . Establish Care    HPI: Ann Cobb is a pleasant 52 y.o. female who presents to [place] [facility] today to review their medical history with me and establish care.   I asked the patient to review their chronic problem list with me to ensure everything was updated and accurate.    All recent office visits with other providers, any medical records that patient brought in etc  - I reviewed today.     We asked pt to get [place] their medical records from [facility] providers/ specialists that they had seen within the past 3-5 years- if they are in private practice and/or do not work for [employer], [facility], [place], [facility] or [employer] owned practice.  Told them to call their specialists to clarify this if they are not sure.   Her Gynecologist, is Dr. Vania Rea with [facility]. Her Dermatologist is Dr. Ubaldo Glassing. She doesn't have a PCP currently. She notes that she exercises 2-3 times a week at the gym, however, her weight is increasing.   She is the [employer] since 2014  and her grandfather started the company in 1946. 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   She has 2 children, a 45 year old daughter at [place] and her 70 year old son. She is married and her husband works as a Occupational psychologist. Her cousin is her business  partner at [employer].   Her mother had a hx of breast cancer that was diagnosed at 53 years old and she is in remission with negative genetic testing completed. She has a sister. She denies any other hx of cancer.   She has Xanax prescription that she only used when her 23 year old son was in a car accident and she still has her first prescription that she hasn't finished.   She denies any other symptoms at this time.    Wt Readings from Last 3 Encounters:  09/13/17 188 lb 12.8 oz (85.6 kg)  02/17/16 174 lb (78.9 kg)  02/03/16 174 lb (78.9 kg)   BP Readings from Last 3 Encounters:  09/13/17 116/79  02/17/16 119/61  12/11/14 110/80   Pulse Readings from Last 3 Encounters:  09/13/17 62  02/17/16 69  12/11/14 70   BMI Readings from Last 3 Encounters:  09/13/17 29.57 kg/m  02/17/16 26.85 kg/m  02/03/16 26.85 kg/m    Patient Care Team    Relationship Specialty Notifications Start End  Mellody Dance, DO PCP - General Family Medicine  09/13/17   Vania Rea, MD Consulting Physician Obstetrics and Gynecology  09/13/17     Patient Active Problem List   Diagnosis Date Noted  . Overweight (BMI 25.0-29.9) 09/13/2017  . Encounter for weight loss counseling 09/13/2017  . Family history of breast cancer- Mom age 20; BRCA Neg 09/13/2017     No past medical history on file.   No past medical history on file.   Past Surgical History:  Procedure Laterality Date  . CESAREAN SECTION    . DILATION AND CURETTAGE OF UTERUS       Family History  Problem Relation Age of Onset  . Cancer Mother        breast  . Diabetes Father   . Heart disease Father   . Hyperlipidemia Father   . Cancer Maternal Grandmother   . Cancer Maternal Grandfather   . Cancer Paternal Grandmother   . Esophageal cancer Paternal Uncle 20  . Colon cancer Neg Hx   . Rectal cancer Neg Hx   . Stomach cancer Neg Hx      Social History   Substance and Sexual Activity  Drug Use No      Social History   Substance and Sexual Activity  Alcohol Use Never  . Frequency: Never     Social History   Tobacco Use  Smoking Status Never Smoker  Smokeless Tobacco Never Used     No outpatient medications have been marked as taking for the 09/13/17 encounter (Office Visit) with Mellody Dance, DO.   Current Facility-Administered Medications for the 09/13/17 encounter (Office Visit) with Mellody Dance, DO  Medication  . 0.9 %  sodium chloride infusion    Allergies: Patient has no known allergies.   ROS   Objective:   Blood pressure 116/79, pulse 62, height '5\' 7"'$  (1.702 m), weight 188 lb 12.8 oz (85.6 kg), SpO2 100 %. Body mass index is 29.57 kg/m. General: Well Developed, well nourished, and in no acute distress.  Neuro: Alert and oriented x3, extra-ocular  muscles intact, sensation grossly intact.  HEENT:[place]/AT, PERRLA, neck supple, No carotid bruits Skin: no gross rashes  Cardiac: Regular rate and rhythm Respiratory: Essentially clear to auscultation bilaterally. Not using accessory muscles, speaking in full sentences.  Abdominal: not grossly distended Musculoskeletal: Ambulates w/o diff, FROM * 4 ext.  Vasc: less 2 sec cap RF, warm and pink  Psych:  No HI/SI, judgement and insight good, Euthymic mood. Full Affect.    No results found for this or any previous visit (from the past 2160 hour(s)).

## 2017-09-21 ENCOUNTER — Emergency Department (HOSPITAL_COMMUNITY): Admission: EM | Admit: 2017-09-21 | Payer: 59 | Source: Home / Self Care

## 2017-09-21 ENCOUNTER — Emergency Department (HOSPITAL_COMMUNITY): Payer: 59

## 2017-09-21 ENCOUNTER — Encounter (HOSPITAL_COMMUNITY): Payer: Self-pay

## 2017-09-21 NOTE — ED Triage Notes (Signed)
Pt reports that she woke up with L thumb pain, denies injury or hx of GOUT

## 2017-10-16 DIAGNOSIS — N926 Irregular menstruation, unspecified: Secondary | ICD-10-CM | POA: Diagnosis not present

## 2017-10-17 ENCOUNTER — Ambulatory Visit: Payer: 59 | Admitting: Family Medicine

## 2017-10-19 NOTE — Patient Instructions (Addendum)
At 160 pounds your BMI would be 24.87.  This would be in the ideal range.  We will see you in 6 months with a goal of 165 pounds and if you get lower great.  If there is anything we can do before then please let us know how we can help support you with your health goals.  When you check out please check with the front desk folks if you have signed paperwork to get information from your dermatologist, ophthalmologist, gynecologist etc.     Preventive Care for Adults, Female  A healthy lifestyle and preventive care can promote health and wellness. Preventive health guidelines for women include the following key practices.   A routine yearly physical is a good way to check with your health care provider about your health and preventive screening. It is a chance to share any concerns and updates on your health and to receive a thorough exam.   Visit your dentist for a routine exam and preventive care every 6 months. Brush your teeth twice a day and floss once a day. Good oral hygiene prevents tooth decay and gum disease.   The frequency of eye exams is based on your age, health, family medical history, use of contact lenses, and other factors. Follow your health care provider's recommendations for frequency of eye exams.   Eat a healthy diet. Foods like vegetables, fruits, whole grains, low-fat dairy products, and lean protein foods contain the nutrients you need without too many calories. Decrease your intake of foods high in solid fats, added sugars, and salt. Eat the right amount of calories for you.Get information about a proper diet from your health care provider, if necessary.   Regular physical exercise is one of the most important things you can do for your health. Most adults should get at least 150 minutes of moderate-intensity exercise (any activity that increases your heart rate and causes you to sweat) each week. In addition, most adults need muscle-strengthening exercises on 2 or  more days a week.   Maintain a healthy weight. The body mass index (BMI) is a screening tool to identify possible weight problems. It provides an estimate of body fat based on height and weight. Your health care provider can find your BMI, and can help you achieve or maintain a healthy weight.For adults 20 years and older:   - A BMI below 18.5 is considered underweight.   - A BMI of 18.5 to 24.9 is normal.   - A BMI of 25 to 29.9 is considered overweight.   - A BMI of 30 and above is considered obese.   Maintain normal blood lipids and cholesterol levels by exercising and minimizing your intake of trans and saturated fats.  Eat a balanced diet with plenty of fruit and vegetables. Blood tests for lipids and cholesterol should begin at age 5 and be repeated every 5 years minimum.  If your lipid or cholesterol levels are high, you are over 40, or you are at high risk for heart disease, you may need your cholesterol levels checked more frequently.Ongoing high lipid and cholesterol levels should be treated with medicines if diet and exercise are not working.   If you smoke, find out from your health care provider how to quit. If you do not use tobacco, do not start.   Lung cancer screening is recommended for adults aged 24-80 years who are at high risk for developing lung cancer because of a history of smoking. A yearly low-dose CT scan  of the lungs is recommended for people who have at least a 30-pack-year history of smoking and are a current smoker or have quit within the past 15 years. A pack year of smoking is smoking an average of 1 pack of cigarettes a day for 1 year (for example: 1 pack a day for 30 years or 2 packs a day for 15 years). Yearly screening should continue until the smoker has stopped smoking for at least 15 years. Yearly screening should be stopped for people who develop a health problem that would prevent them from having lung cancer treatment.   If you are pregnant, do not  drink alcohol. If you are breastfeeding, be very cautious about drinking alcohol. If you are not pregnant and choose to drink alcohol, do not have more than 1 drink per day. One drink is considered to be 12 ounces (355 mL) of beer, 5 ounces (148 mL) of wine, or 1.5 ounces (44 mL) of liquor.   Avoid use of street drugs. Do not share needles with anyone. Ask for help if you need support or instructions about stopping the use of drugs.   High blood pressure causes heart disease and increases the risk of stroke. Your blood pressure should be checked at least yearly.  Ongoing high blood pressure should be treated with medicines if weight loss and exercise do not work.   If you are 65-69 years old, ask your health care provider if you should take aspirin to prevent strokes.   Diabetes screening involves taking a blood sample to check your fasting blood sugar level. This should be done once every 3 years, after age 36, if you are within normal weight and without risk factors for diabetes. Testing should be considered at a younger age or be carried out more frequently if you are overweight and have at least 1 risk factor for diabetes.   Breast cancer screening is essential preventive care for women. You should practice "breast self-awareness."  This means understanding the normal appearance and feel of your breasts and may include breast self-examination.  Any changes detected, no matter how small, should be reported to a health care provider.  Women in their 79s and 30s should have a clinical breast exam (CBE) by a health care provider as part of a regular health exam every 1 to 3 years.  After age 33, women should have a CBE every year.  Starting at age 51, women should consider having a mammogram (breast X-ray test) every year.  Women who have a family history of breast cancer should talk to their health care provider about genetic screening.  Women at a high risk of breast cancer should talk to their  health care providers about having an MRI and a mammogram every year.   -Breast cancer gene (BRCA)-related cancer risk assessment is recommended for women who have family members with BRCA-related cancers. BRCA-related cancers include breast, ovarian, tubal, and peritoneal cancers. Having family members with these cancers may be associated with an increased risk for harmful changes (mutations) in the breast cancer genes BRCA1 and BRCA2. Results of the assessment will determine the need for genetic counseling and BRCA1 and BRCA2 testing.   The Pap test is a screening test for cervical cancer. A Pap test can show cell changes on the cervix that might become cervical cancer if left untreated. A Pap test is a procedure in which cells are obtained and examined from the lower end of the uterus (cervix).   - Women  should have a Pap test starting at age 58.   - Between ages 88 and 85, Pap tests should be repeated every 2 years.   - Beginning at age 16, you should have a Pap test every 3 years as long as the past 3 Pap tests have been normal.   - Some women have medical problems that increase the chance of getting cervical cancer. Talk to your health care provider about these problems. It is especially important to talk to your health care provider if a new problem develops soon after your last Pap test. In these cases, your health care provider may recommend more frequent screening and Pap tests.   - The above recommendations are the same for women who have or have not gotten the vaccine for human papillomavirus (HPV).   - If you had a hysterectomy for a problem that was not cancer or a condition that could lead to cancer, then you no longer need Pap tests. Even if you no longer need a Pap test, a regular exam is a good idea to make sure no other problems are starting.   - If you are between ages 27 and 23 years, and you have had normal Pap tests going back 10 years, you no longer need Pap tests. Even if  you no longer need a Pap test, a regular exam is a good idea to make sure no other problems are starting.   - If you have had past treatment for cervical cancer or a condition that could lead to cancer, you need Pap tests and screening for cancer for at least 20 years after your treatment.   - If Pap tests have been discontinued, risk factors (such as a new sexual partner) need to be reassessed to determine if screening should be resumed.   - The HPV test is an additional test that may be used for cervical cancer screening. The HPV test looks for the virus that can cause the cell changes on the cervix. The cells collected during the Pap test can be tested for HPV. The HPV test could be used to screen women aged 81 years and older, and should be used in women of any age who have unclear Pap test results. After the age of 72, women should have HPV testing at the same frequency as a Pap test.   Colorectal cancer can be detected and often prevented. Most routine colorectal cancer screening begins at the age of 74 years and continues through age 42 years. However, your health care provider may recommend screening at an earlier age if you have risk factors for colon cancer. On a yearly basis, your health care provider may provide home test kits to check for hidden blood in the stool.  Use of a small camera at the end of a tube, to directly examine the colon (sigmoidoscopy or colonoscopy), can detect the earliest forms of colorectal cancer. Talk to your health care provider about this at age 74, when routine screening begins. Direct exam of the colon should be repeated every 5 -10 years through age 43 years, unless early forms of pre-cancerous polyps or small growths are found.   People who are at an increased risk for hepatitis B should be screened for this virus. You are considered at high risk for hepatitis B if:  -You were born in a country where hepatitis B occurs often. Talk with your health care  provider about which countries are considered high risk.  - Your parents were  born in a high-risk country and you have not received a shot to protect against hepatitis B (hepatitis B vaccine).  - You have HIV or AIDS.  - You use needles to inject street drugs.  - You live with, or have sex with, someone who has Hepatitis B.  - You get hemodialysis treatment.  - You take certain medicines for conditions like cancer, organ transplantation, and autoimmune conditions.   Hepatitis C blood testing is recommended for all people born from [birth year] through 1965 and any individual with known risks for hepatitis C.   Practice safe sex. Use condoms and avoid high-risk sexual practices to reduce the spread of sexually transmitted infections (STIs). STIs include gonorrhea, chlamydia, syphilis, trichomonas, herpes, HPV, and human immunodeficiency virus (HIV). Herpes, HIV, and HPV are viral illnesses that have no cure. They can result in disability, cancer, and death. Sexually active women aged 27 years and younger should be checked for chlamydia. Older women with new or multiple partners should also be tested for chlamydia. Testing for other STIs is recommended if you are sexually active and at increased risk.   Osteoporosis is a disease in which the bones lose minerals and strength with aging. This can result in serious bone fractures or breaks. The risk of osteoporosis can be identified using a bone density scan. Women ages 48 years and over and women at risk for fractures or osteoporosis should discuss screening with their health care providers. Ask your health care provider whether you should take a calcium supplement or vitamin D to There are also several preventive steps women can take to avoid osteoporosis and resulting fractures or to keep osteoporosis from worsening. -->Recommendations include:  Eat a balanced diet high in fruits, vegetables, calcium, and vitamins.  Get enough calcium. The recommended  total intake of is 1,200 mg daily; for best absorption, if taking supplements, divide doses into 250-500 mg doses throughout the day. Of the two types of calcium, calcium carbonate is best absorbed when taken with food but calcium citrate can be taken on an empty stomach.  Get enough vitamin D. [employer] and the [employer] recommend at least 1,000 IU per day for women age 90 and over who are at risk of vitamin D deficiency. Vitamin D deficiency can be caused by inadequate sun exposure (for example, those who live in [place]).  Avoid alcohol and smoking. Heavy alcohol intake (more than 7 drinks per week) increases the risk of falls and hip fracture and women smokers tend to lose bone more rapidly and have lower bone mass than nonsmokers. Stopping smoking is one of the most important changes women can make to improve their health and decrease risk for disease.  Be physically active every day. Weight-bearing exercise (for example, fast walking, hiking, jogging, and weight training) may strengthen bones or slow the rate of bone loss that comes with aging. Balancing and muscle-strengthening exercises can reduce the risk of falling and fracture.  Consider therapeutic medications. Currently, several types of effective drugs are available. Healthcare providers can recommend the type most appropriate for each woman.  Eliminate environmental factors that may contribute to accidents. Falls cause nearly 90% of all osteoporotic fractures, so reducing this risk is an important bone-health strategy. Measures include ample lighting, removing obstructions to walking, using nonskid rugs on floors, and placing mats and/or grab bars in showers.  Be aware of medication side effects. Some common medicines make bones weaker. These include a type of steroid drug called glucocorticoids used for  arthritis and asthma, some antiseizure drugs, certain sleeping pills, treatments for endometriosis, and some  cancer drugs. An overactive thyroid gland or using too much thyroid hormone for an underactive thyroid can also be a problem. If you are taking these medicines, talk to your doctor about what you can do to help protect your bones.reduce the rate of osteoporosis.    Menopause can be associated with physical symptoms and risks. Hormone replacement therapy is available to decrease symptoms and risks. You should talk to your health care provider about whether hormone replacement therapy is right for you.   Use sunscreen. Apply sunscreen liberally and repeatedly throughout the day. You should seek shade when your shadow is shorter than you. Protect yourself by wearing long sleeves, pants, a wide-brimmed hat, and sunglasses year round, whenever you are outdoors.   Once a month, do a whole body skin exam, using a mirror to look at the skin on your back. Tell your health care provider of new moles, moles that have irregular borders, moles that are larger than a pencil eraser, or moles that have changed in shape or color.   -Stay current with required vaccines (immunizations).   Influenza vaccine. All adults should be immunized every year.  Tetanus, diphtheria, and acellular pertussis (Td, Tdap) vaccine. Pregnant women should receive 1 dose of Tdap vaccine during each pregnancy. The dose should be obtained regardless of the length of time since the last dose. Immunization is preferred during the 27th 36th week of gestation. An adult who has not previously received Tdap or who does not know her vaccine status should receive 1 dose of Tdap. This initial dose should be followed by tetanus and diphtheria toxoids (Td) booster doses every 10 years. Adults with an unknown or incomplete history of completing a 3-dose immunization series with Td-containing vaccines should begin or complete a primary immunization series including a Tdap dose. Adults should receive a Td booster every 10 years.  Varicella vaccine. An  adult without evidence of immunity to varicella should receive 2 doses or a second dose if she has previously received 1 dose. Pregnant females who do not have evidence of immunity should receive the first dose after pregnancy. This first dose should be obtained before leaving the health care facility. The second dose should be obtained 4 8 weeks after the first dose.  Human papillomavirus (HPV) vaccine. Females aged 43 26 years who have not received the vaccine previously should obtain the 3-dose series. The vaccine is not recommended for use in pregnant females. However, pregnancy testing is not needed before receiving a dose. If a female is found to be pregnant after receiving a dose, no treatment is needed. In that case, the remaining doses should be delayed until after the pregnancy. Immunization is recommended for any person with an immunocompromised condition through the age of 66 years if she did not get any or all doses earlier. During the 3-dose series, the second dose should be obtained 4 8 weeks after the first dose. The third dose should be obtained 24 weeks after the first dose and 16 weeks after the second dose.  Zoster vaccine. One dose is recommended for adults aged 69 years or older unless certain conditions are present.  Measles, mumps, and rubella (MMR) vaccine. Adults born before [birth year] generally are considered immune to measles and mumps. Adults born in [birth year] or later should have 1 or more doses of MMR vaccine unless there is a contraindication to the vaccine or there is laboratory evidence  of immunity to each of the three diseases. A routine second dose of MMR vaccine should be obtained at least 28 days after the first dose for students attending postsecondary schools, health care workers, or international travelers. People who received inactivated measles vaccine or an unknown type of measles vaccine during 1963 1967 should receive 2 doses of MMR vaccine. People who received inactivated  mumps vaccine or an unknown type of mumps vaccine before 1979 and are at high risk for mumps infection should consider immunization with 2 doses of MMR vaccine. For females of childbearing age, rubella immunity should be determined. If there is no evidence of immunity, females who are not pregnant should be vaccinated. If there is no evidence of immunity, females who are pregnant should delay immunization until after pregnancy. Unvaccinated health care workers born before 40 who lack laboratory evidence of measles, mumps, or rubella immunity or laboratory confirmation of disease should consider measles and mumps immunization with 2 doses of MMR vaccine or rubella immunization with 1 dose of MMR vaccine.  Pneumococcal 13-valent conjugate (PCV13) vaccine. When indicated, a person who is uncertain of her immunization history and has no record of immunization should receive the PCV13 vaccine. An adult aged 28 years or older who has certain medical conditions and has not been previously immunized should receive 1 dose of PCV13 vaccine. This PCV13 should be followed with a dose of pneumococcal polysaccharide (PPSV23) vaccine. The PPSV23 vaccine dose should be obtained at least 8 weeks after the dose of PCV13 vaccine. An adult aged 84 years or older who has certain medical conditions and previously received 1 or more doses of PPSV23 vaccine should receive 1 dose of PCV13. The PCV13 vaccine dose should be obtained 1 or more years after the last PPSV23 vaccine dose.  Pneumococcal polysaccharide (PPSV23) vaccine. When PCV13 is also indicated, PCV13 should be obtained first. All adults aged 20 years and older should be immunized. An adult younger than age 37 years who has certain medical conditions should be immunized. Any person who resides in a nursing home or long-term care facility should be immunized. An adult smoker should be immunized. People with an immunocompromised condition and certain other conditions should  receive both PCV13 and PPSV23 vaccines. People with human immunodeficiency virus (HIV) infection should be immunized as soon as possible after diagnosis. Immunization during chemotherapy or radiation therapy should be avoided. Routine use of PPSV23 vaccine is not recommended for American Indians, [place] Natives, or people younger than 65 years unless there are medical conditions that require PPSV23 vaccine. When indicated, people who have unknown immunization and have no record of immunization should receive PPSV23 vaccine. One-time revaccination 5 years after the first dose of PPSV23 is recommended for people aged 41 64 years who have chronic kidney failure, nephrotic syndrome, asplenia, or immunocompromised conditions. People who received 1 2 doses of PPSV23 before age 2 years should receive another dose of PPSV23 vaccine at age 36 years or later if at least 5 years have passed since the previous dose. Doses of PPSV23 are not needed for people immunized with PPSV23 at or after age 73 years.  Meningococcal vaccine. Adults with asplenia or persistent complement component deficiencies should receive 2 doses of quadrivalent meningococcal conjugate (MenACWY-D) vaccine. The doses should be obtained at least 2 months apart. Microbiologists working with certain meningococcal bacteria, [place] recruits, people at risk during an outbreak, and people who travel to or live in countries with a high rate of meningitis should be immunized. A  first-year college student up through age 70 years who is living in a residence hall should receive a dose if she did not receive a dose on or after her 16th birthday. Adults who have certain high-risk conditions should receive one or more doses of vaccine.  Hepatitis A vaccine. Adults who wish to be protected from this disease, have certain high-risk conditions, work with hepatitis A-infected animals, work in hepatitis A research labs, or travel to or work in countries with a high  rate of hepatitis A should be immunized. Adults who were previously unvaccinated and who anticipate close contact with an international adoptee during the first 60 days after arrival in the [place] from a country with a high rate of hepatitis A should be immunized.  Hepatitis B vaccine.  Adults who wish to be protected from this disease, have certain high-risk conditions, may be exposed to blood or other infectious body fluids, are household contacts or sex partners of hepatitis B positive people, are clients or workers in certain care facilities, or travel to or work in countries with a high rate of hepatitis B should be immunized.  Haemophilus influenzae type b (Hib) vaccine. A previously unvaccinated person with asplenia or sickle cell disease or having a scheduled splenectomy should receive 1 dose of Hib vaccine. Regardless of previous immunization, a recipient of a hematopoietic stem cell transplant should receive a 3-dose series 6 12 months after her successful transplant. Hib vaccine is not recommended for adults with HIV infection.  Preventive Services / Frequency Ages 15 to 39years  Blood pressure check.** / Every 1 to 2 years.  Lipid and cholesterol check.** / Every 5 years beginning at age 68.  Clinical breast exam.** / Every 3 years for women in their 55s and 26s.  BRCA-related cancer risk assessment.** / For women who have family members with a BRCA-related cancer (breast, ovarian, tubal, or peritoneal cancers).  Pap test.** / Every 2 years from ages 78 through 72. Every 3 years starting at age 63 through age 36 or 53 with a history of 3 consecutive normal Pap tests.  HPV screening.** / Every 3 years from ages 19 through ages 53 to 66 with a history of 3 consecutive normal Pap tests.  Hepatitis C blood test.** / For any individual with known risks for hepatitis C.  Skin self-exam. / Monthly.  Influenza vaccine. / Every year.  Tetanus, diphtheria, and acellular pertussis  (Tdap, Td) vaccine.** / Consult your health care provider. Pregnant women should receive 1 dose of Tdap vaccine during each pregnancy. 1 dose of Td every 10 years.  Varicella vaccine.** / Consult your health care provider. Pregnant females who do not have evidence of immunity should receive the first dose after pregnancy.  HPV vaccine. / 3 doses over 6 months, if 19 and younger. The vaccine is not recommended for use in pregnant females. However, pregnancy testing is not needed before receiving a dose.  Measles, mumps, rubella (MMR) vaccine.** / You need at least 1 dose of MMR if you were born in 1957 or later. You may also need a 2nd dose. For females of childbearing age, rubella immunity should be determined. If there is no evidence of immunity, females who are not pregnant should be vaccinated. If there is no evidence of immunity, females who are pregnant should delay immunization until after pregnancy.  Pneumococcal 13-valent conjugate (PCV13) vaccine.** / Consult your health care provider.  Pneumococcal polysaccharide (PPSV23) vaccine.** / 1 to 2 doses if you smoke cigarettes  or if you have certain conditions.  Meningococcal vaccine.** / 1 dose if you are age 48 to 105 years and a Market researcher living in a residence hall, or have one of several medical conditions, you need to get vaccinated against meningococcal disease. You may also need additional booster doses.  Hepatitis A vaccine.** / Consult your health care provider.  Hepatitis B vaccine.** / Consult your health care provider.  Haemophilus influenzae type b (Hib) vaccine.** / Consult your health care provider.  Ages 67 to 64years  Blood pressure check.** / Every 1 to 2 years.  Lipid and cholesterol check.** / Every 5 years beginning at age 89 years.  Lung cancer screening. / Every year if you are aged 37 80 years and have a 30-pack-year history of smoking and currently smoke or have quit within the past 15 years.  Yearly screening is stopped once you have quit smoking for at least 15 years or develop a health problem that would prevent you from having lung cancer treatment.  Clinical breast exam.** / Every year after age 18 years.  BRCA-related cancer risk assessment.** / For women who have family members with a BRCA-related cancer (breast, ovarian, tubal, or peritoneal cancers).  Mammogram.** / Every year beginning at age 44 years and continuing for as long as you are in good health. Consult with your health care provider.  Pap test.** / Every 3 years starting at age 54 years through age 25 or 3 years with a history of 3 consecutive normal Pap tests.  HPV screening.** / Every 3 years from ages 26 years through ages 59 to 48 years with a history of 3 consecutive normal Pap tests.  Fecal occult blood test (FOBT) of stool. / Every year beginning at age 79 years and continuing until age 41 years. You may not need to do this test if you get a colonoscopy every 10 years.  Flexible sigmoidoscopy or colonoscopy.** / Every 5 years for a flexible sigmoidoscopy or every 10 years for a colonoscopy beginning at age 56 years and continuing until age 50 years.  Hepatitis C blood test.** / For all people born from [birth year] through 1965 and any individual with known risks for hepatitis C.  Skin self-exam. / Monthly.  Influenza vaccine. / Every year.  Tetanus, diphtheria, and acellular pertussis (Tdap/Td) vaccine.** / Consult your health care provider. Pregnant women should receive 1 dose of Tdap vaccine during each pregnancy. 1 dose of Td every 10 years.  Varicella vaccine.** / Consult your health care provider. Pregnant females who do not have evidence of immunity should receive the first dose after pregnancy.  Zoster vaccine.** / 1 dose for adults aged 58 years or older.  Measles, mumps, rubella (MMR) vaccine.** / You need at least 1 dose of MMR if you were born in 1957 or later. You may also need a 2nd dose. For  females of childbearing age, rubella immunity should be determined. If there is no evidence of immunity, females who are not pregnant should be vaccinated. If there is no evidence of immunity, females who are pregnant should delay immunization until after pregnancy.  Pneumococcal 13-valent conjugate (PCV13) vaccine.** / Consult your health care provider.  Pneumococcal polysaccharide (PPSV23) vaccine.** / 1 to 2 doses if you smoke cigarettes or if you have certain conditions.  Meningococcal vaccine.** / Consult your health care provider.  Hepatitis A vaccine.** / Consult your health care provider.  Hepatitis B vaccine.** / Consult your health care provider.  Haemophilus influenzae type  b (Hib) vaccine.** / Consult your health care provider.  Ages 38 years and over  Blood pressure check.** / Every 1 to 2 years.  Lipid and cholesterol check.** / Every 5 years beginning at age 17 years.  Lung cancer screening. / Every year if you are aged 18 80 years and have a 30-pack-year history of smoking and currently smoke or have quit within the past 15 years. Yearly screening is stopped once you have quit smoking for at least 15 years or develop a health problem that would prevent you from having lung cancer treatment.  Clinical breast exam.** / Every year after age 48 years.  BRCA-related cancer risk assessment.** / For women who have family members with a BRCA-related cancer (breast, ovarian, tubal, or peritoneal cancers).  Mammogram.** / Every year beginning at age 32 years and continuing for as long as you are in good health. Consult with your health care provider.  Pap test.** / Every 3 years starting at age 79 years through age 25 or 60 years with 3 consecutive normal Pap tests. Testing can be stopped between 65 and 70 years with 3 consecutive normal Pap tests and no abnormal Pap or HPV tests in the past 10 years.  HPV screening.** / Every 3 years from ages 78 years through ages 42 or 42  years with a history of 3 consecutive normal Pap tests. Testing can be stopped between 65 and 70 years with 3 consecutive normal Pap tests and no abnormal Pap or HPV tests in the past 10 years.  Fecal occult blood test (FOBT) of stool. / Every year beginning at age 56 years and continuing until age 51 years. You may not need to do this test if you get a colonoscopy every 10 years.  Flexible sigmoidoscopy or colonoscopy.** / Every 5 years for a flexible sigmoidoscopy or every 10 years for a colonoscopy beginning at age 9 years and continuing until age 48 years.  Hepatitis C blood test.** / For all people born from [birth year] through 1965 and any individual with known risks for hepatitis C.  Osteoporosis screening.** / A one-time screening for women ages 85 years and over and women at risk for fractures or osteoporosis.  Skin self-exam. / Monthly.  Influenza vaccine. / Every year.  Tetanus, diphtheria, and acellular pertussis (Tdap/Td) vaccine.** / 1 dose of Td every 10 years.  Varicella vaccine.** / Consult your health care provider.  Zoster vaccine.** / 1 dose for adults aged 68 years or older.  Pneumococcal 13-valent conjugate (PCV13) vaccine.** / Consult your health care provider.  Pneumococcal polysaccharide (PPSV23) vaccine.** / 1 dose for all adults aged 60 years and older.  Meningococcal vaccine.** / Consult your health care provider.  Hepatitis A vaccine.** / Consult your health care provider.  Hepatitis B vaccine.** / Consult your health care provider.  Haemophilus influenzae type b (Hib) vaccine.** / Consult your health care provider. ** Family history and personal history of risk and conditions may change your health care provider's recommendations. Document Released: 04/11/2001 Document Revised: 12/04/2012  [facility] Patient Information 2014 [place], [place].   EXERCISE AND DIET:  We recommended that you start or continue a regular exercise program for good health. Regular  exercise means any activity that makes your heart beat faster and makes you sweat.  We recommend exercising at least 30 minutes per day at least 3 days a week, preferably 5.  We also recommend a diet low in fat and sugar / carbohydrates.  Inactivity, poor dietary choices and  obesity can cause diabetes, heart attack, stroke, and kidney damage, among others.     ALCOHOL AND SMOKING:  Women should limit their alcohol intake to no more than 7 drinks/beers/glasses of wine (combined, not each!) per week. Moderation of alcohol intake to this level decreases your risk of breast cancer and liver damage.  ( And of course, no recreational drugs are part of a healthy lifestyle.)  Also, you should not be smoking at all or even being exposed to second hand smoke. Most people know smoking can cause cancer, and various heart and lung diseases, but did you know it also contributes to weakening of your bones?  Aging of your skin?  Yellowing of your teeth and nails?   CALCIUM AND VITAMIN D:  Adequate intake of calcium and Vitamin D are recommended.  The recommendations for exact amounts of these supplements seem to change often, but generally speaking 600 mg of calcium (either carbonate or citrate) and 800 units of Vitamin D per day seems prudent. Certain women may benefit from higher intake of Vitamin D.  If you are among these women, your doctor will have told you during your visit.     PAP SMEARS:  Pap smears, to check for cervical cancer or precancers,  have traditionally been done yearly, although recent scientific advances have shown that most women can have pap smears less often.  However, every woman still should have a physical exam from her gynecologist or primary care physician every year. It will include a breast check, inspection of the vulva and vagina to check for abnormal growths or skin changes, a visual exam of the cervix, and then an exam to evaluate the size and shape of the uterus and ovaries.  And  after 53 years of age, a rectal exam is indicated to check for rectal cancers. We will also provide age appropriate advice regarding health maintenance, like when you should have certain vaccines, screening for sexually transmitted diseases, bone density testing, colonoscopy, mammograms, etc.    MAMMOGRAMS:  All women over 63 years old should have a yearly mammogram. Many facilities now offer a "3D" mammogram, which may cost around $50 extra out of pocket. If possible,  we recommend you accept the option to have the 3D mammogram performed.  It both reduces the number of women who will be called back for extra views which then turn out to be normal, and it is better than the routine mammogram at detecting truly abnormal areas.     COLONOSCOPY:  Colonoscopy to screen for colon cancer is recommended for all women at age 8.  We know, you hate the idea of the prep.  We agree, BUT, having colon cancer and not knowing it is worse!!  Colon cancer so often starts as a polyp that can be seen and removed at colonscopy, which can quite literally save your life!  And if your first colonoscopy is normal and you have no family history of colon cancer, most women don't have to have it again for 10 years.  Once every ten years, you can do something that may end up saving your life, right?  We will be happy to help you get it scheduled when you are ready.  Be sure to check your insurance coverage so you understand how much it will cost.  It may be covered as a preventative service at no cost, but you should check your particular policy.

## 2017-10-22 ENCOUNTER — Ambulatory Visit (INDEPENDENT_AMBULATORY_CARE_PROVIDER_SITE_OTHER): Payer: 59 | Admitting: Family Medicine

## 2017-10-22 ENCOUNTER — Encounter: Payer: Self-pay | Admitting: Family Medicine

## 2017-10-22 VITALS — BP 114/69 | HR 71 | Resp 16 | Ht 67.25 in | Wt 176.0 lb

## 2017-10-22 DIAGNOSIS — Z0000 Encounter for general adult medical examination without abnormal findings: Secondary | ICD-10-CM | POA: Diagnosis not present

## 2017-10-22 DIAGNOSIS — Z719 Counseling, unspecified: Secondary | ICD-10-CM | POA: Diagnosis not present

## 2017-10-22 DIAGNOSIS — Z23 Encounter for immunization: Secondary | ICD-10-CM | POA: Diagnosis not present

## 2017-10-22 DIAGNOSIS — E663 Overweight: Secondary | ICD-10-CM | POA: Diagnosis not present

## 2017-10-22 NOTE — Progress Notes (Signed)
Impression and Recommendations:    1. Encounter for wellness examination   2. Health education/counseling   3. Overweight (BMI 25.0-29.9)   4. Need for Tdap vaccination     Lifestyle -Pt set overall weight loss goal for roughly 165lbs -Encouraged pt to set follow up for weight loss goals in 6 months -Explained to patient what BMI refers to, and what it means medically. Told patient to think about it as a "medical risk stratification measurement" and how increasing BMI is associated with increasing risk/ or worsening state of various diseases such as hypertension, hyperlipidemia, diabetes, premature OA, depression etc.  -American Heart Association guidelines for healthy diet, basically Mediterranean diet, and exercise guidelines of 30 minutes 5 days per week or more discussed in detail.  -Health counseling performed.  All questions answered.  Menorrhagia -Pt is seeing Dr. Stann Mainland for care; will defer to gynecology  Immunizations -Requested CMA order shingrix and tetanus booster for pt -Discussed the importance of shingles vaccine and tetanus booster  -Recommended pt contact her Insurance avoid lack of coverage  Blood Panel -Pt will get fasting blood work at the conclusion of this [place] pt stool cards for fecal testing -Encouraged pt to have her medical records sent to our office for maintenance -Discussed importance of regular skin checks with partner to notice any changes -Encouraged pt to make a dermatology appointment in the near future -Educated pt on dietary changes to improve colon and overall health -Explained methods for personal breast exams and answered concerns from pateint  Anticipatory Guidance: Discussed importance of wearing a seatbelt while driving, not texting while driving; sunscreen when outside along with yearly skin surveillance; eating a well balanced and modest diet; physical activity at least 25 minutes per day or 150 min/ week of  moderate to intense activity.  Immunizations / Screenings / Labs:  All immunizations and screenings that patient agrees to, are up-to-date per recommendations or will be updated today.  Patient understands the needs for q 16modental and yearly vision screens which pt will schedule independently. Obtain CBC, CMP, HgA1c, Lipid panel, TSH and vit D when fasting if not already done recently.    Orders Placed This Encounter  Procedures  . Tdap vaccine greater than or equal to 7yo IM  . CBC with Differential/Platelet  . Comprehensive metabolic panel  . TSH  . VITAMIN D 25 Hydroxy (Vit-D Deficiency, Fractures)  . Hemoglobin A1c  . Lipid panel  . T4, free  . Hepatitis C antibody  . HIV antibody    Gross side effects, risk and benefits, and alternatives of medications discussed with patient.  Patient is aware that all medications have potential side effects and we are unable to predict every side effect or drug-drug interaction that may occur.  Expresses verbal understanding and consents to current therapy plan and treatment regimen.  F-up preventative CPE in 1 year. F/up sooner for chronic care management as discussed and prn.  Please see orders placed and AVS handed out to patient at the end of our visit for further patient instructions/ counseling done pertaining to today's office visit.  This document serves as a record of services personally performed by DMellody Dance MD. It was created on her behalf by LGeorga Bora a trained medical scribe. The creation of this record is based on the scribe's personal observations and the provider's statements to them.   I have reviewed the above medical documentation for accuracy and completeness and I concur.  DNeoma Laming  Findley Vi 10/23/17 5:51 PM      Subjective:    Chief Complaint  Patient presents with  . Annual Exam     HPI: Ann Cobb is a 52 y.o. female who presents to [place] [facility] today a yearly  health maintenance exam.  Health Maintenance Summary Reviewed and updated, unless pt declines services.  Menorrhagia -Pt state she has been menstruating since June without relief; she is seeing Dr. Stann Mainland at gynecology for her concerns -Pap smear was done last year in Nov 2018  GI health -PT states she had her last colonoscopy done in 2017 -States she has some polyps and was told to return 5 years from previous appointment -Pt says she has no GI symptoms, denies diarrhea, constipation, changes   Lifestyle -Pt exercises 3-4 times per week for 45-60 min -States she gets moderate level of exercise, with heavy breathing -States she has been using the [employer] app to improve her diet, has been drinking more water -Has lost roughly 15 pounds within the last month -States eating out and fast food are her problem areas and   Eye Exam -Pt saw Dr. Pandora Leiter "last year" for eye exam with dilation  Dentistry -States she has an appointment to see her dentist in September -Has a regular dentist appointment every 6 months  Dermatology -States she goes "once every couple years" to Dr. Martin[place] for skin maintenance -Pt doesn't regularly check skin for changes  Tobacco History Reviewed:   Y  CT scan for screening lung CA:   n/a Alcohol:    No concerns, no excessive use STD concerns:   none Drug Use:   None Birth control method:   n/a Menses regular:     Post Menopausal Lumps or breast concerns:      no Breast Cancer Family History:      Yes; mother, BRCA Neg    Immunization History  Administered Date(s) Administered  . Tdap 10/22/2017    Health Maintenance  Topic Date Due  . PAP SMEAR  12/21/1985  . INFLUENZA VACCINE  09/27/2017  . MAMMOGRAM  09/14/2018 (Originally 12/22/2014)  . HIV Screening  09/14/2018 (Originally 12/22/1979)  . COLONOSCOPY  02/16/2026  . TETANUS/TDAP  10/23/2027     Wt Readings from Last 3 Encounters:  10/22/17 176 lb (79.8 kg)  09/13/17 188 lb 12.8 oz (85.6 kg)   02/17/16 174 lb (78.9 kg)   BP Readings from Last 3 Encounters:  10/22/17 114/69  09/13/17 116/79  02/17/16 119/61   Pulse Readings from Last 3 Encounters:  10/22/17 71  09/13/17 62  02/17/16 69     No past medical history on file.    Past Surgical History:  Procedure Laterality Date  . CESAREAN SECTION    . DILATION AND CURETTAGE OF UTERUS        Family History  Problem Relation Age of Onset  . Cancer Mother        breast  . Diabetes Father   . Heart disease Father   . Hyperlipidemia Father   . Cancer Maternal Grandmother   . Cancer Maternal Grandfather   . Cancer Paternal Grandmother   . Esophageal cancer Paternal Uncle 83  . Colon cancer Neg Hx   . Rectal cancer Neg Hx   . Stomach cancer Neg Hx       Social History   Substance and Sexual Activity  Drug Use No  ,   Social History   Substance and Sexual Activity  Alcohol Use Never  . Frequency: Never  ,   Social History   Tobacco Use  Smoking Status Never Smoker  Smokeless Tobacco Never Used  ,   Social History   Substance and Sexual Activity  Sexual Activity Yes  . Birth control/protection: None    Current Outpatient Medications on File Prior to Visit  Medication Sig Dispense Refill  . Levonorgest-Eth Estrad-Fe Bisg (BALCOLTRA) 0.1-20 MG-MCG(21) TABS Take 1 tablet by mouth.     No current facility-administered medications on file prior to visit.     Allergies: Patient has no known allergies.  Review of Systems: General:   Denies fever, chills, unexplained weight loss.  Optho/Auditory:   Denies visual changes, blurred vision/LOV Respiratory:   Denies SOB, DOE more than baseline levels.  Cardiovascular:   Denies chest pain, palpitations, new onset peripheral edema  Gastrointestinal:   Denies nausea, vomiting, diarrhea.  Genitourinary: Denies dysuria, freq/ urgency, flank pain or discharge from genitals.  Endocrine:     Denies hot or cold intolerance, polyuria,  polydipsia. Musculoskeletal:   Denies unexplained myalgias, joint swelling, unexplained arthralgias, gait problems.  Skin:  Denies rash, suspicious lesions Neurological:     Denies dizziness, unexplained weakness, numbness  Psychiatric/Behavioral:   Denies mood changes, suicidal or homicidal ideations, hallucinations    Objective:    Blood pressure 114/69, pulse 71, resp. rate 16, height 5' 7.25" (1.708 m), weight 176 lb (79.8 kg). Body mass index is 27.36 kg/m. General Appearance:    Alert, cooperative, no distress, appears stated age  Head:    Normocephalic, without obvious abnormality, atraumatic  Eyes:    PERRL, conjunctiva/corneas clear, EOM's intact, fundi    benign, both eyes  Ears:    Normal TM's and external ear canals, both ears  Nose:   Nares normal, septum midline, mucosa normal, no drainage    or sinus tenderness  Throat:   Lips w/o lesion, mucosa moist, and tongue normal; teeth and   gums normal  Neck:   Supple, symmetrical, trachea midline, no adenopathy;    thyroid:  no enlargement/tenderness/nodules; no carotid   bruit or JVD  Back:     Symmetric, no curvature, ROM normal, no CVA tenderness  Lungs:     Clear to auscultation bilaterally, respirations unlabored, no       Wh/ R/ R  Chest Wall:    No tenderness or gross deformity; normal excursion   Heart:    Regular rate and rhythm, S1 and S2 normal, no murmur, rub   or gallop  Breast Exam:    No tenderness, masses, or nipple abnormality b/l; no d/c  Abdomen:     Soft, non-tender, bowel sounds active all four quadrants, NO   G/R/R, no masses, no organomegaly  Genitalia: Pt sees Dr. Stann Mainland for gynecological needs      Extremities:   Extremities normal, atraumatic, no cyanosis or gross edema  Pulses:   2+ and symmetric all extremities  Skin:   Warm, dry, Skin color, texture, turgor normal, no obvious rashes or lesions Psych: No HI/SI, judgement and insight good, Euthymic mood. Full Affect.  Neurologic:   CNII-XII  intact, normal strength, sensation and reflexes    Throughout

## 2017-10-23 LAB — COMPREHENSIVE METABOLIC PANEL
A/G RATIO: 1.8 (ref 1.2–2.2)
ALBUMIN: 4.2 g/dL (ref 3.5–5.5)
ALK PHOS: 42 IU/L (ref 39–117)
ALT: 18 IU/L (ref 0–32)
AST: 13 IU/L (ref 0–40)
BILIRUBIN TOTAL: 0.3 mg/dL (ref 0.0–1.2)
BUN / CREAT RATIO: 12 (ref 9–23)
BUN: 12 mg/dL (ref 6–24)
CHLORIDE: 105 mmol/L (ref 96–106)
CO2: 23 mmol/L (ref 20–29)
Calcium: 9.1 mg/dL (ref 8.7–10.2)
Creatinine, Ser: 1.01 mg/dL — ABNORMAL HIGH (ref 0.57–1.00)
GFR calc non Af Amer: 64 mL/min/{1.73_m2} (ref >59)
GFR, EST AFRICAN AMERICAN: 74 mL/min/{1.73_m2} (ref >59)
Globulin, Total: 2.3 g/dL (ref 1.5–4.5)
Glucose: 89 mg/dL (ref 65–99)
POTASSIUM: 4.6 mmol/L (ref 3.5–5.2)
SODIUM: 142 mmol/L (ref 134–144)
Total Protein: 6.5 g/dL (ref 6.0–8.5)

## 2017-10-23 LAB — HIV ANTIBODY (ROUTINE TESTING W REFLEX): HIV Screen 4th Generation wRfx: NONREACTIVE

## 2017-10-23 LAB — LIPID PANEL
CHOL/HDL RATIO: 3.5 ratio (ref 0.0–4.4)
Cholesterol, Total: 191 mg/dL (ref 100–199)
HDL: 55 mg/dL (ref >39)
LDL CALC: 123 mg/dL — AB (ref 0–99)
Triglycerides: 67 mg/dL (ref 0–149)
VLDL CHOLESTEROL CAL: 13 mg/dL (ref 5–40)

## 2017-10-23 LAB — HEMOGLOBIN A1C
Est. average glucose Bld gHb Est-mCnc: 100 mg/dL
Hgb A1c MFr Bld: 5.1 % (ref 4.8–5.6)

## 2017-10-23 LAB — CBC WITH DIFFERENTIAL/PLATELET
BASOS: 0 %
Basophils Absolute: 0 10*3/uL (ref 0.0–0.2)
EOS (ABSOLUTE): 0 10*3/uL (ref 0.0–0.4)
Eos: 0 %
Hematocrit: 42.5 % (ref 34.0–46.6)
Hemoglobin: 13.9 g/dL (ref 11.1–15.9)
IMMATURE GRANS (ABS): 0 10*3/uL (ref 0.0–0.1)
Immature Granulocytes: 0 %
LYMPHS ABS: 1.5 10*3/uL (ref 0.7–3.1)
LYMPHS: 31 %
MCH: 29.4 pg (ref 26.6–33.0)
MCHC: 32.7 g/dL (ref 31.5–35.7)
MCV: 90 fL (ref 79–97)
MONOCYTES: 8 %
Monocytes Absolute: 0.4 10*3/uL (ref 0.1–0.9)
NEUTROS ABS: 3 10*3/uL (ref 1.4–7.0)
Neutrophils: 61 %
Platelets: 256 10*3/uL (ref 150–450)
RBC: 4.72 x10E6/uL (ref 3.77–5.28)
RDW: 12.9 % (ref 12.3–15.4)
WBC: 5 10*3/uL (ref 3.4–10.8)

## 2017-10-23 LAB — TSH: TSH: 1.59 u[IU]/mL (ref 0.450–4.500)

## 2017-10-23 LAB — T4, FREE: FREE T4: 1.26 ng/dL (ref 0.82–1.77)

## 2017-10-23 LAB — VITAMIN D 25 HYDROXY (VIT D DEFICIENCY, FRACTURES): Vit D, 25-Hydroxy: 38.2 ng/mL (ref 30.0–100.0)

## 2017-10-23 LAB — HEPATITIS C ANTIBODY: Hep C Virus Ab: <0.1 s/co ratio (ref 0.0–0.9)

## 2018-01-28 DIAGNOSIS — Z1231 Encounter for screening mammogram for malignant neoplasm of breast: Secondary | ICD-10-CM | POA: Diagnosis not present

## 2018-01-28 DIAGNOSIS — Z124 Encounter for screening for malignant neoplasm of cervix: Secondary | ICD-10-CM | POA: Diagnosis not present

## 2018-01-28 DIAGNOSIS — Z01419 Encounter for gynecological examination (general) (routine) without abnormal findings: Secondary | ICD-10-CM | POA: Diagnosis not present

## 2018-01-29 LAB — HM MAMMOGRAPHY

## 2018-01-30 ENCOUNTER — Encounter: Payer: Self-pay | Admitting: Family Medicine

## 2018-01-30 ENCOUNTER — Ambulatory Visit: Payer: 59 | Admitting: Family Medicine

## 2018-01-30 VITALS — BP 117/78 | HR 73 | Temp 98.6°F | Ht 67.0 in | Wt 170.3 lb

## 2018-01-30 DIAGNOSIS — R300 Dysuria: Secondary | ICD-10-CM | POA: Diagnosis not present

## 2018-01-30 DIAGNOSIS — N3001 Acute cystitis with hematuria: Secondary | ICD-10-CM | POA: Diagnosis not present

## 2018-01-30 DIAGNOSIS — R350 Frequency of micturition: Secondary | ICD-10-CM

## 2018-01-30 LAB — POCT URINALYSIS DIPSTICK
Bilirubin, UA: NEGATIVE
Glucose, UA: NEGATIVE
Ketones, UA: NEGATIVE
NITRITE UA: POSITIVE
PH UA: 6 (ref 5.0–8.0)
Protein, UA: NEGATIVE
Spec Grav, UA: <=1.005 — AB (ref 1.010–1.025)
UROBILINOGEN UA: 0.2 U/dL

## 2018-01-30 MED ORDER — NITROFURANTOIN MONOHYD MACRO 100 MG PO CAPS: 100.0000 mg | ORAL_CAPSULE | Freq: Two times a day (BID) | ORAL | 0 refills | Status: DC

## 2018-01-30 MED ORDER — PHENAZOPYRIDINE HCL 200 MG PO TABS: 200.0000 mg | ORAL_TABLET | Freq: Three times a day (TID) | ORAL | 0 refills | Status: AC

## 2018-01-30 NOTE — Patient Instructions (Signed)
Patient declined AVS 

## 2018-01-30 NOTE — Progress Notes (Signed)
Impression and Recommendations:    1. Acute cystitis with hematuria   2. Urine frequency   3. Dysuria     1. Positive Urinalysis - UTI - Urinalysis positive for signs of infection.  - Antibiotics prescribed today, Macrobid.  See med list.  - Encouraged pushing fluids and resting, and tylenol for pain.  - Per pt, has not had a UTI in years.  - Per pt, does not get yeast infections from antibiotics.  - Prescription written for prescription strength pyridium.  - Recommended that patient call GYN and ask about hormone testing, hormone replacement, and help with assessing whether she is going through the change in life.   Education and routine counseling performed. Handouts provided.   Orders Placed This Encounter  Procedures  . Urine Culture  . POCT urinalysis dipstick     Meds ordered this encounter  Medications  . nitrofurantoin, macrocrystal-monohydrate, (MACROBID) 100 MG capsule    Sig: Take 1 capsule (100 mg total) by mouth 2 (two) times daily.    Dispense:  10 capsule    Refill:  0  . phenazopyridine (PYRIDIUM) 200 MG tablet    Sig: Take 1 tablet (200 mg total) by mouth 3 (three) times daily for 3 days.    Dispense:  9 tablet    Refill:  0    Patient may or may not pick up    The patient was counseled, risk factors were discussed, anticipatory guidance given.  Gross side effects, risk and benefits, and alternatives of medications discussed with patient.  Patient is aware that all medications have potential side effects and we are unable to predict every side effect or drug-drug interaction that may occur.  Expresses verbal understanding and consents to current therapy plan and treatment regimen.   Return if symptoms worsen or fail to improve, for Follow-up as previously discussed.   This document serves as a record of services personally performed by Mellody Dance, DO. It was created on her behalf by Toni Amend, a trained medical scribe.  The creation of this record is based on the scribe's personal observations and the provider's statements to them.   I have reviewed the above medical documentation for accuracy and completeness and I concur.  Mellody Dance, DO 02/01/2018 2:34 PM        Subjective:    HPI: Ann Cobb is a 52 y.o. female who presents to [place] [facility] today for c/o suspected UTI.  States she has not had a UTI in years.  Sx for about one, maybe 1.5 days.    Went to her gynecologist Monday (2 days ago, today is Wednesday) for pap smear and mammogram.  Had no urinary symptoms at that time.  C/O:  Yesterday, noticed her urine was cloudy and had an odor.  Notes "probably should not have had my [facility] like I drank yesterday."  Woke up at 3 AM this morning, went to the bathroom; states "it burned and had some blood."  Has urinated since then; experienced additional burning this morning.  Has been drinking a lot of water.  Feels a little more frequency, but also drinking more water as reported, so is unsure as to whether that is due to her sx.  Feels some lower abdominal pressure.  Denies: Fever, chills, nausea, vomiting.  No more sex than usual recently.  Urinalysis    Component Value Date/Time   BILIRUBINUR negative 01/30/2018 1050   PROTEINUR  Negative 01/30/2018 1050   UROBILINOGEN 0.2 01/30/2018 1050   NITRITE positive 01/30/2018 1050   LEUKOCYTESUR Trace (A) 01/30/2018 1050    Wt Readings from Last 3 Encounters:  01/30/18 170 lb 4.8 oz (77.2 kg)  10/22/17 176 lb (79.8 kg)  09/13/17 188 lb 12.8 oz (85.6 kg)   BP Readings from Last 3 Encounters:  01/30/18 117/78  10/22/17 114/69  09/13/17 116/79   Pulse Readings from Last 3 Encounters:  01/30/18 73  10/22/17 71  09/13/17 62   BMI Readings from Last 3 Encounters:  01/30/18 26.67 kg/m  10/22/17 27.36 kg/m  09/13/17 29.57 kg/m     Patient Active Problem List   Diagnosis Date Noted  .  Overweight (BMI 25.0-29.9) 09/13/2017  . Encounter for weight loss counseling 09/13/2017  . Family history of breast cancer- Mom age 62; BRCA Neg 09/13/2017    Past Surgical History:  Procedure Laterality Date  . CESAREAN SECTION    . DILATION AND CURETTAGE OF UTERUS      Family History  Problem Relation Age of Onset  . Cancer Mother        breast  . Diabetes Father   . Heart disease Father   . Hyperlipidemia Father   . Cancer Maternal Grandmother   . Cancer Maternal Grandfather   . Cancer Paternal Grandmother   . Esophageal cancer Paternal Uncle 65  . Colon cancer Neg Hx   . Rectal cancer Neg Hx   . Stomach cancer Neg Hx     Social History   Substance and Sexual Activity  Drug Use No  ,  Social History   Substance and Sexual Activity  Alcohol Use Never  . Frequency: Never  ,  Social History   Tobacco Use  Smoking Status Never Smoker  Smokeless Tobacco Never Used  ,  Social History   Substance and Sexual Activity  Sexual Activity Yes  . Birth control/protection: None    Patient's Medications  New Prescriptions   NITROFURANTOIN, MACROCRYSTAL-MONOHYDRATE, (MACROBID) 100 MG CAPSULE    Take 1 capsule (100 mg total) by mouth 2 (two) times daily.   PHENAZOPYRIDINE (PYRIDIUM) 200 MG TABLET    Take 1 tablet (200 mg total) by mouth 3 (three) times daily for 3 days.  Previous Medications   NORETHINDRONE-ETHINYL ESTRADIOL (LOESTRIN FE 1/20) 1-20 MG-MCG TABLET    Lo Loestrin Fe 1 mg-10 mcg (24)/10 mcg (2) tablet  Take 1 tablet every day by oral route.  Modified Medications   No medications on file  Discontinued Medications   LEVONORGEST-ETH ESTRAD-FE BISG (BALCOLTRA) 0.1-20 MG-MCG(21) TABS    Take 1 tablet by mouth.    Patient has no known allergies.  Current Meds  Medication Sig  . norethindrone-ethinyl estradiol (LOESTRIN FE 1/20) 1-20 MG-MCG tablet Lo Loestrin Fe 1 mg-10 mcg (24)/10 mcg (2) tablet  Take 1 tablet every day by oral route.    Review of  Systems: General:   No F/C, wt loss Pulm:   No DIB, pleuritic chest pain Card:  No CP, palpitations Abd:  No n/v/d or pain GU:  Dysuria, increased frequency and urgency; no vaginal discharge Ext:  No inc edema from baseline   Objective:  Blood pressure 117/78, pulse 73, temperature 98.6 F (37 C), height _0  (1.702 m), weight 170 lb 4.8 oz (77.2 kg), SpO2 98 %. Body mass index is 26.67 kg/m.  General: Well Developed, well nourished, and in no acute distress.  HEENT: Normocephalic, atraumatic Skin: Warm and dry, cap RF  less 2 sec, good turgor CV: +S1, S2 Respiratory: ECTA B/L; speaking in full sentences, no conversational dyspnea Abd: Soft, NT, ND, No G/R/R, no SPT, No flank pain NeuroM-Sk: Ambulates w/o assistance, moves * 4 Psych: A and O *3

## 2018-02-01 LAB — URINE CULTURE

## 2018-03-13 DIAGNOSIS — L820 Inflamed seborrheic keratosis: Secondary | ICD-10-CM | POA: Diagnosis not present

## 2018-03-13 DIAGNOSIS — D22112 Melanocytic nevi of right lower eyelid, including canthus: Secondary | ICD-10-CM | POA: Diagnosis not present

## 2018-03-13 DIAGNOSIS — D225 Melanocytic nevi of trunk: Secondary | ICD-10-CM | POA: Diagnosis not present

## 2018-03-13 DIAGNOSIS — D485 Neoplasm of uncertain behavior of skin: Secondary | ICD-10-CM | POA: Diagnosis not present

## 2018-03-13 DIAGNOSIS — D2239 Melanocytic nevi of other parts of face: Secondary | ICD-10-CM | POA: Diagnosis not present

## 2018-03-26 ENCOUNTER — Encounter: Payer: Self-pay | Admitting: Adult Health

## 2018-03-26 ENCOUNTER — Ambulatory Visit: Payer: 59 | Admitting: Adult Health

## 2018-03-26 VITALS — BP 103/65 | HR 55 | Temp 98.5°F | Ht 67.0 in | Wt 166.9 lb

## 2018-03-26 DIAGNOSIS — J029 Acute pharyngitis, unspecified: Secondary | ICD-10-CM

## 2018-03-26 LAB — POCT RAPID STREP A (OFFICE): RAPID STREP A SCREEN: NEGATIVE

## 2018-03-26 MED ORDER — PREDNISONE 20 MG PO TABS
ORAL_TABLET | ORAL | 0 refills | Status: DC
Start: 2018-03-26 — End: 2018-03-26

## 2018-03-26 NOTE — Progress Notes (Signed)
Subjective:    Patient ID: Ann Cobb, female    DOB: 07/22/1965, 53 y.o.   MRN: [id number]  HPI:  Mr. Ann Cobb presents with sore throat (8/10, described as "hard to swallow"), intermittent non-productive cough, fever, (highest 99.6 f oral) and chills that started 2 days ago. She denies nasal drainage, CP/dyspnea/dizziness/palpitations/N/V/D. 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 She has been taking OTC Ibuprofen every 8 hrs, last dose 1030 today, current temp 98.37foral She denies tobacco/vape use  Patient Care Team    Relationship Specialty Notifications Start End  OMellody Dance DO PCP - General Family Medicine  09/13/17   WVania Rea MD Consulting Physician Obstetrics and Gynecology  09/13/17     Patient Active Problem List   Diagnosis Date Noted  . Pharyngitis 03/26/2018  . Overweight (BMI 25.0-29.9) 09/13/2017  . Encounter for weight loss counseling 09/13/2017  . Family history of breast cancer- Mom age 54 BRCA Neg 09/13/2017     History reviewed. No pertinent past medical history.   Past Surgical History:  Procedure Laterality Date  . CESAREAN SECTION    . DILATION AND CURETTAGE OF UTERUS       Family History  Problem Relation Age of Onset  . Cancer Mother        breast  . Diabetes Father   . Heart disease Father   . Hyperlipidemia Father   . Cancer Maternal Grandmother   . Cancer Maternal Grandfather   . Cancer Paternal Grandmother   . Esophageal cancer Paternal Uncle 749 . Colon cancer Neg Hx   . Rectal cancer Neg Hx   . Stomach cancer Neg Hx      Social History   Substance and Sexual Activity  Drug Use No     Social History   Substance and Sexual Activity  Alcohol Use Never  . Frequency: Never     Social History   Tobacco Use  Smoking Status Never Smoker  Smokeless Tobacco Never Used     Outpatient Encounter Medications as of 03/26/2018  Medication Sig  . norethindrone-ethinyl  estradiol (LOESTRIN FE 1/20) 1-20 MG-MCG tablet Lo Loestrin Fe 1 mg-10 mcg (24)/10 mcg (2) tablet  Take 1 tablet every day by oral route.  . [DISCONTINUED] nitrofurantoin, macrocrystal-monohydrate, (MACROBID) 100 MG capsule Take 1 capsule (100 mg total) by mouth 2 (two) times daily.  . [DISCONTINUED] predniSONE (DELTASONE) 20 MG tablet 1 tab every 12 hrs for 3 days, then 1 tab every day for 3 days   No facility-administered encounter medications on file as of 03/26/2018.     Allergies: Patient has no known allergies.  Body mass index is 26.14 kg/m.  Blood pressure 103/65, pulse (!) 55, temperature 98.5 F (36.9 C), temperature source Oral, height _0  (1.702 m), weight 166 lb 14.4 oz (75.7 kg), SpO2 98 %.  Review of Systems  Constitutional: Positive for fatigue. Negative for activity change, appetite change, chills, diaphoresis, fever and unexpected weight change.  HENT: Positive for sore throat. Negative for congestion, ear discharge, ear pain, postnasal drip, rhinorrhea, sinus pressure, trouble swallowing and voice change.   Eyes: Negative for visual disturbance.  Respiratory: Positive for cough. Negative for chest tightness, shortness of breath, wheezing and stridor.   Cardiovascular: Negative for chest pain, palpitations and leg swelling.  Endocrine: Negative for cold intolerance, heat intolerance, polydipsia, polyphagia and polyuria.  Genitourinary: Negative for difficulty urinating and flank pain.  Neurological: Negative for dizziness and headaches.  Hematological: Does  not bruise/bleed easily.  Psychiatric/Behavioral: Negative for sleep disturbance.       Objective:   Physical Exam Vitals signs and nursing note reviewed.  Constitutional:      General: She is not in acute distress.    Appearance: She is normal weight. She is not ill-appearing, toxic-appearing or diaphoretic.  HENT:     Head: Normocephalic and atraumatic.     Right Ear: Tympanic membrane and ear canal  normal. No drainage, swelling or tenderness. No middle ear effusion. Tympanic membrane is not erythematous.     Left Ear: Tympanic membrane and ear canal normal. No drainage, swelling or tenderness.  No middle ear effusion. Tympanic membrane is not erythematous.     Mouth/Throat:     Mouth: Mucous membranes are moist.     Pharynx: Posterior oropharyngeal erythema present. No oropharyngeal exudate.     Tonsils: Tonsillar exudate present. No tonsillar abscesses. Swelling: 1+ on the right. 1+ on the left.  Eyes:     Conjunctiva/sclera: Conjunctivae normal.     Pupils: Pupils are equal, round, and reactive to light.  Neck:     Musculoskeletal: Normal range of motion.  Cardiovascular:     Rate and Rhythm: Normal rate.     Heart sounds: Normal heart sounds. No murmur. No friction rub. No gallop.   Pulmonary:     Effort: Pulmonary effort is normal. No respiratory distress.     Breath sounds: No stridor. No wheezing, rhonchi or rales.  Chest:     Chest wall: No tenderness.  Skin:    General: Skin is warm and dry.     Capillary Refill: Capillary refill takes less than 2 seconds.  Neurological:     Mental Status: She is alert and oriented to person, place, and time.  Psychiatric:        Mood and Affect: Mood normal.        Assessment & Plan:   1. Pharyngitis, unspecified etiology     Pharyngitis Strep Test Negative Continue to drink plenty of water and use OTC Ibuprofen.    FOLLOW-UP:  Return if symptoms worsen or fail to improve.

## 2018-03-26 NOTE — Assessment & Plan Note (Addendum)
Strep Test Negative Declined Prednisone Taper Continue to drink plenty of water and use OTC Ibuprofen.

## 2018-03-26 NOTE — Patient Instructions (Addendum)

## 2018-04-24 ENCOUNTER — Ambulatory Visit: Payer: 59 | Admitting: Family Medicine

## 2019-03-11 ENCOUNTER — Other Ambulatory Visit: Payer: Self-pay | Admitting: Obstetrics & Gynecology

## 2019-03-11 DIAGNOSIS — R928 Other abnormal and inconclusive findings on diagnostic imaging of breast: Secondary | ICD-10-CM

## 2019-03-11 LAB — HM MAMMOGRAPHY

## 2019-03-13 ENCOUNTER — Ambulatory Visit
Admission: RE | Admit: 2019-03-13 | Discharge: 2019-03-13 | Disposition: A | Discharge status: Home / Self Care | Payer: 59 | Source: Ambulatory Visit | Attending: Obstetrics & Gynecology | Admitting: Obstetrics & Gynecology

## 2019-03-13 ENCOUNTER — Ambulatory Visit: Payer: 59

## 2019-03-13 ENCOUNTER — Other Ambulatory Visit: Payer: Self-pay

## 2019-03-13 DIAGNOSIS — R928 Other abnormal and inconclusive findings on diagnostic imaging of breast: Secondary | ICD-10-CM

## 2019-03-13 LAB — HM MAMMOGRAPHY

## 2019-05-09 ENCOUNTER — Ambulatory Visit: Payer: 59 | Attending: Internal Medicine

## 2019-05-09 DIAGNOSIS — Z23 Encounter for immunization: Secondary | ICD-10-CM

## 2019-05-09 NOTE — Progress Notes (Signed)
   Covid-19 Vaccination Clinic  Name:  Ann Cobb    MRN: [id number] DOB: 1965-09-13  05/09/2019  [name] was observed post Covid-19 immunization for 15 minutes without incident. She was provided with Vaccine Information Sheet and instruction to access the V-Safe system.   [name] was instructed to call 911 with any severe reactions post vaccine: Marland Kitchen Difficulty breathing  . Swelling of face and throat  . A fast heartbeat  . A bad rash all over body  . Dizziness and weakness   Immunizations Administered    Name Date Dose VIS Date Route   Pfizer COVID-19 Vaccine 05/09/2019  4:03 PM 0.3 mL 02/07/2019 Intramuscular   Manufacturer: [place]   Lot: [id number]   [place]: [id number]

## 2019-06-02 ENCOUNTER — Ambulatory Visit: Payer: 59 | Attending: Internal Medicine

## 2019-06-02 DIAGNOSIS — Z23 Encounter for immunization: Secondary | ICD-10-CM

## 2019-06-02 NOTE — Progress Notes (Signed)
   Covid-19 Vaccination Clinic  Name:  Ann Cobb    MRN: [id number] DOB: 1966/01/03  06/02/2019  [name] was observed post Covid-19 immunization for 15 minutes without incident. She was provided with Vaccine Information Sheet and instruction to access the V-Safe system.   [name] was instructed to call 911 with any severe reactions post vaccine: Marland Kitchen Difficulty breathing  . Swelling of face and throat  . A fast heartbeat  . A bad rash all over body  . Dizziness and weakness   Immunizations Administered    Name Date Dose VIS Date Route   Pfizer COVID-19 Vaccine 06/02/2019  5:24 PM 0.3 mL 02/07/2019 Intramuscular   Manufacturer: [place]   Lot: [id number]   [place]: [id number]

## 2020-03-29 LAB — HM MAMMOGRAPHY

## 2020-03-29 LAB — LIPID PANEL
Cholesterol: 226 — AB (ref 0–200)
HDL: 70 (ref 35–70)
LDL Cholesterol: 145
Triglycerides: 63 (ref 40–160)

## 2020-03-29 LAB — TSH: TSH: 1.38 (ref 0.41–5.90)

## 2020-03-29 LAB — CBC AND DIFFERENTIAL: Hemoglobin: 5.5 — AB (ref 12.0–16.0)

## 2020-04-06 ENCOUNTER — Encounter: Payer: Self-pay | Admitting: Physician Assistant

## 2020-04-20 ENCOUNTER — Encounter: Payer: Self-pay | Admitting: Physician Assistant

## 2020-06-25 ENCOUNTER — Ambulatory Visit: Payer: 59 | Admitting: Physician Assistant

## 2020-06-25 ENCOUNTER — Encounter: Payer: Self-pay | Admitting: Physician Assistant

## 2020-06-25 VITALS — Ht 67.0 in | Wt 180.0 lb

## 2020-06-25 DIAGNOSIS — R110 Nausea: Secondary | ICD-10-CM

## 2020-06-25 NOTE — Patient Instructions (Signed)

## 2020-06-25 NOTE — Progress Notes (Signed)
Telehealth office visit note for Ann Reid, PA-C- at [facility]   I connected with current patient today by telephone and verified that I am speaking with the correct person   . Location of the patient: Home . Location of the provider: Office (home) - This visit type was conducted due to national recommendations for restrictions regarding the COVID-19 Pandemic (e.g. social distancing) in an effort to limit this patient's exposure and mitigate transmission in our community.    - No physical exam could be performed with this format, beyond that communicated to [place] by the patient/ family members as noted.   - Additionally my office staff/ schedulers were to discuss with the patient that there may be a monetary charge related to this service, depending on their medical insurance.  My understanding is that patient understood and consented to proceed.     _________________________________________________________________________________   History of Present Illness: Patient calls in with c/o nausea feeling above naval for several weeks. Patient reports has no issues with eating, denies abdominal pain, vomiting, belching, flatulence, bowel changes or unintentional weight loss. Denies new medications. Reports does take several vitamins including multivitamin, iron, vitamin D3, B12 and zinc. Patient started iron supplement few months ago for possible iron deficiency anemia (patient had annual female exam with ob-gyn 02/2020).     No flowsheet data found.  Depression screen [facility] 2/9 06/25/2020 03/26/2018 01/30/2018 10/22/2017 09/13/2017  Decreased Interest 0 0 0 0 0  Down, Depressed, Hopeless 0 0 0 0 0  PHQ - 2 Score 0 0 0 0 0  Altered sleeping 0 0 0 0 0  Tired, decreased energy 1 0 0 0 0  Change in appetite 0 0 0 0 0  Feeling bad or failure about yourself  0 0 0 0 0  Trouble concentrating 0 0 0 0 0  Moving slowly or fidgety/restless 0 0 0 0 0  Suicidal thoughts 0 0 0 0 0   PHQ-9 Score 1 0 0 0 0  Difficult doing work/chores - - Not difficult at all Not difficult at all Not difficult at all      Impression and Recommendations:     1. Nausea    Nausea: -Discussed with patient potential etiologies and likely medication side effect from iron supplement which is the latest she added to regimen. Recommend to continue with multivitamin and temporarily hold other supplements. Since symptom location is described in epigastric region recommend trial of H2 blocker such as famotidine/Pepcid for possible GERD related. If symptoms fail to improve or worsen recommend further evaluation with labs, imaging studies or referral to gastroenterology. Patient verbalized understanding.  -Patient declined antiemetic at this time. Reports nausea is not significant enough to consider medication. -Reviewed extracted labs from [facility]: hemoglobin 13.1, hematocrit 39.4, RBC 4.52  Advised to schedule general physical in the near future. Patient verbalized understanding.        - As part of my medical decision making, I reviewed the following data within the [place] History obtained from pt /family, CMA notes reviewed and incorporated if applicable, Labs reviewed, Radiograph/ tests reviewed if applicable and OV notes from prior OV's with me, as well as any other specialists she/he has seen since seeing me last, were all reviewed and used in my medical decision making process today.    - Additionally, when appropriate, discussion had with patient regarding our treatment plan, and their biases/concerns about that plan were used  in my medical decision making today.    - The patient agreed with the plan and demonstrated an understanding of the instructions.   No barriers to understanding were identified.     - The patient was advised to call back or seek an in-person evaluation if the symptoms worsen or if the condition fails to improve as  anticipated.   Return if symptoms worsen or fail to improve.    No orders of the defined types were placed in this encounter.   No orders of the defined types were placed in this encounter.   There are no discontinued medications.     Time spent on telephone encounter was 10 minutes.      The [place] was signed into law in 2016 which includes the topic of electronic health records.  This provides immediate access to information in MyChart.  This includes consultation notes, operative notes, office notes, lab results and pathology reports.  If you have any questions about what you read please let us know at your next visit or call us at the office.  We are right here with you.   __________________________________________________________________________________     Patient Care Team    Relationship Specialty Notifications Start End  Ann Cobb, [place] PCP - General   06/29/19   Vania Rea, MD Consulting Physician Obstetrics and Gynecology  09/13/17      -Vitals obtained; medications/ allergies reconciled;  personal medical, social, Sx etc.histories were updated by CMA, reviewed by me and are reflected in chart   Patient Active Problem List   Diagnosis Date Noted  . Pharyngitis 03/26/2018  . Overweight (BMI 25.0-29.9) 09/13/2017  . Encounter for weight loss counseling 09/13/2017  . Family history of breast cancer- Mom age 52; BRCA Neg 09/13/2017     No outpatient medications have been marked as taking for the 06/25/20 encounter (Office Visit) with Ann Reid, PA-C.     Allergies:  No Known Allergies   ROS:  See above HPI for pertinent positives and negatives   Objective:   Height $Remov'5\' 7"'liSHbY$  (1.702 m), weight 180 lb (81.6 kg).  (if some vitals are omitted, this means that patient was UNABLE to obtain them even. ) General: A & O * 3; sounds in no acute distress; in usual state of health.  Respiratory: speaking in full sentences, no  conversational dyspnea Psych: insight appears good, mood- appears full

## 2020-06-30 ENCOUNTER — Telehealth: Payer: Self-pay | Admitting: Physician Assistant

## 2020-06-30 NOTE — Telephone Encounter (Signed)
Called patient who states she has contacted her Dermatologist and they are going to care for this issue. AS, CMA

## 2020-06-30 NOTE — Telephone Encounter (Signed)
Patient was bitten in the shower on her face. Symptoms are Redness, hard bump, no discharge. No fever. Please advise, thanks.

## 2020-08-06 ENCOUNTER — Telehealth: Payer: Self-pay | Admitting: Physician Assistant

## 2020-08-06 NOTE — Telephone Encounter (Signed)
Patient thinks she has a sinus infection. Fever is around 100 last night, no fever this morning. Two negative at home COVID test. There is also drainage, mucus with blood, and not feelin well. Please advise, thanks.

## 2021-02-21 IMAGING — MG MM DIGITAL DIAGNOSTIC UNILAT*L* W/ TOMO W/ CAD
4 series · 4 of 12 positions shown · non-contrast
Comparison: Previous exam(s).

CLINICAL DATA: 53-year-old female for further evaluation of
possible LEFT breast asymmetry on screening mammogram.

EXAM:
DIGITAL DIAGNOSTIC UNILATERAL LEFT MAMMOGRAM WITH CAD AND TOMO

[L ML synth-2D]
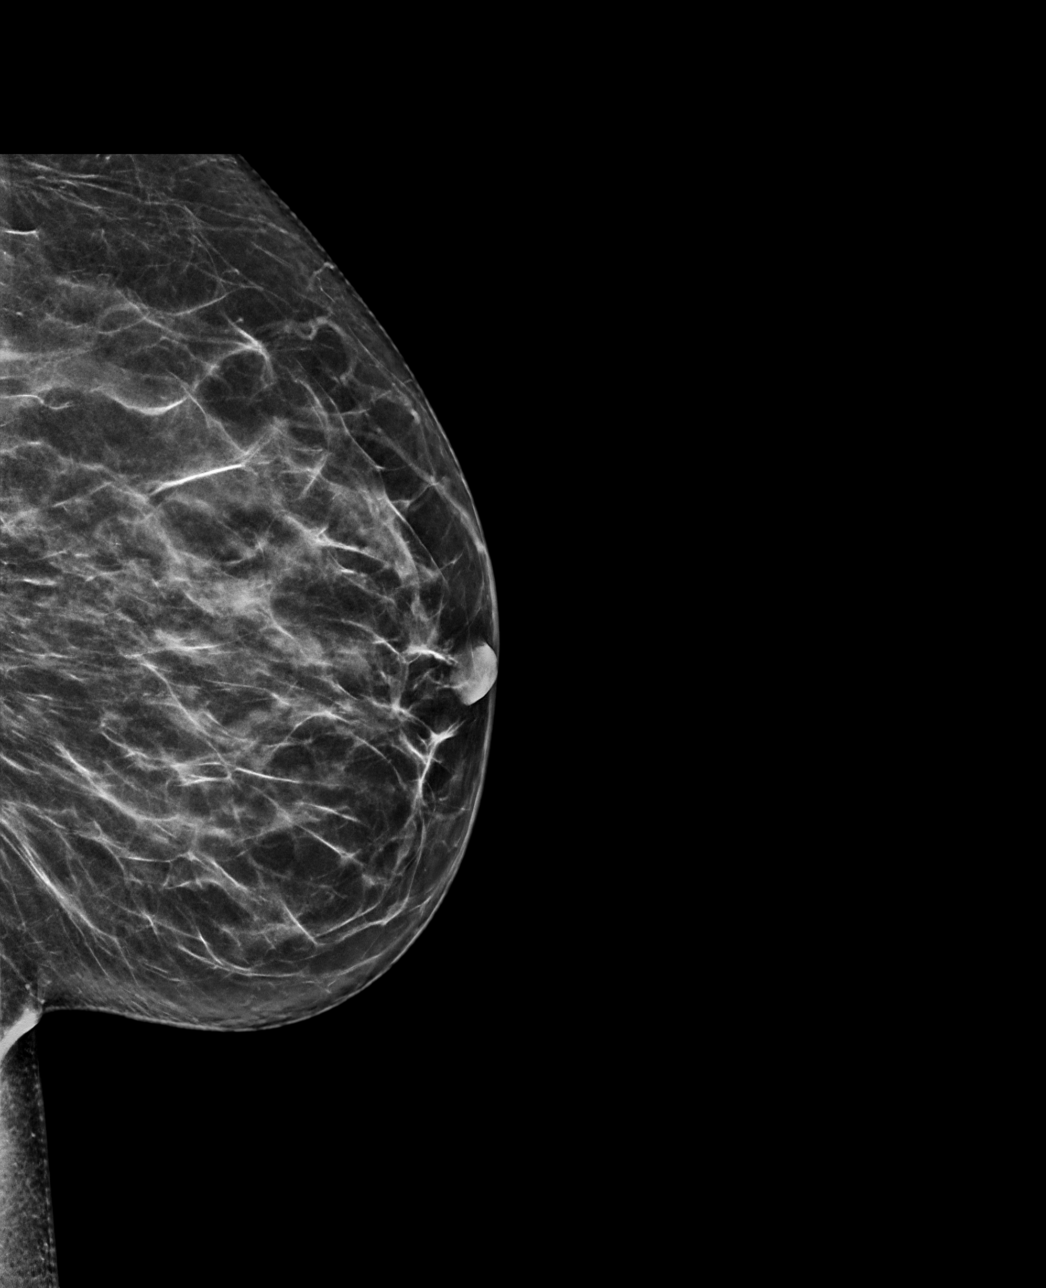

[L CC synth-2D]
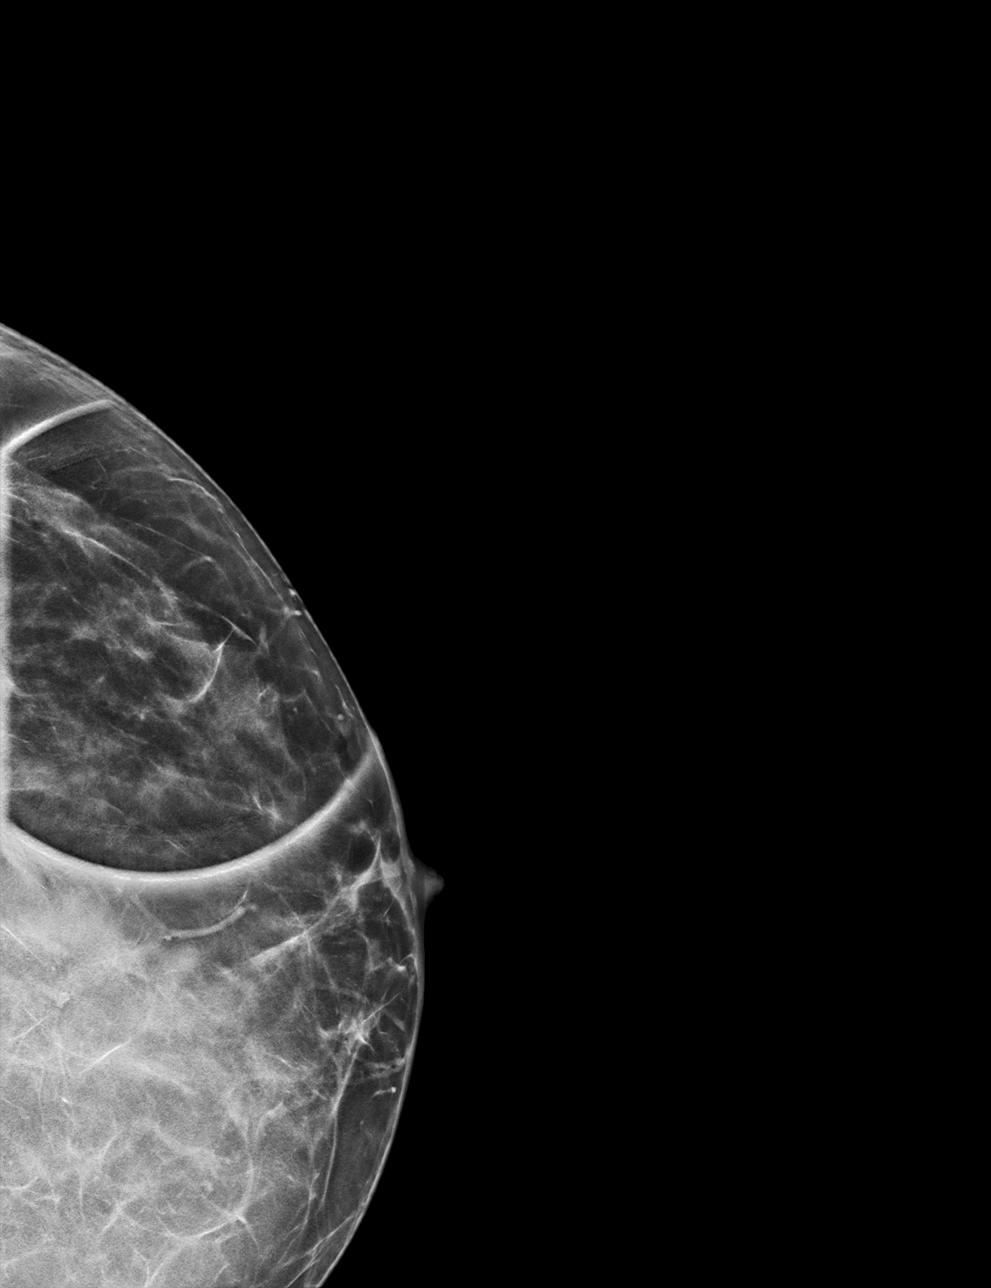

[L CC tomo · tomo slice 27/53.0]
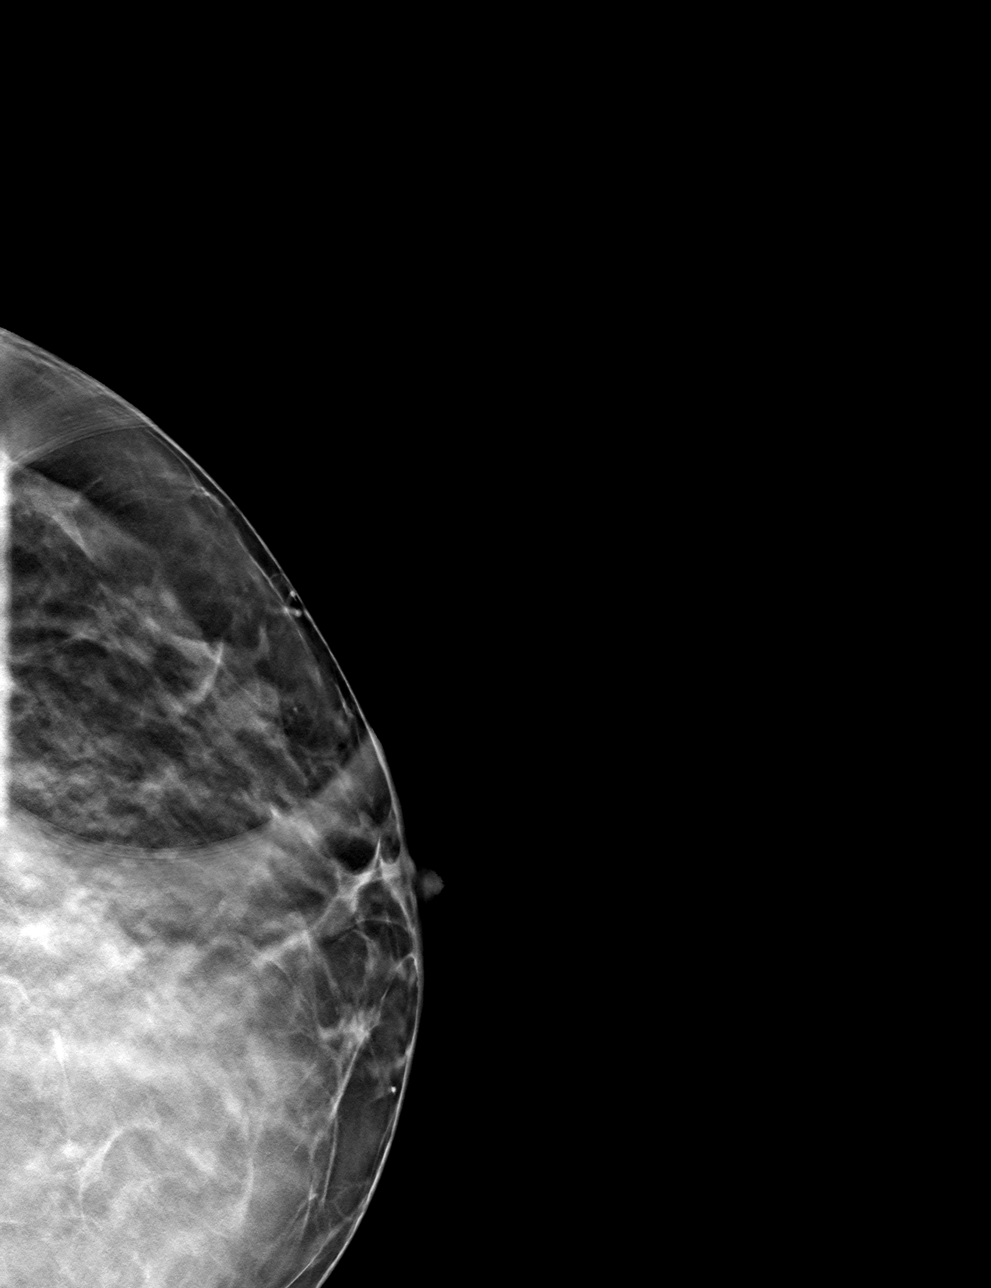

[L ML tomo · tomo slice 35/68.0]
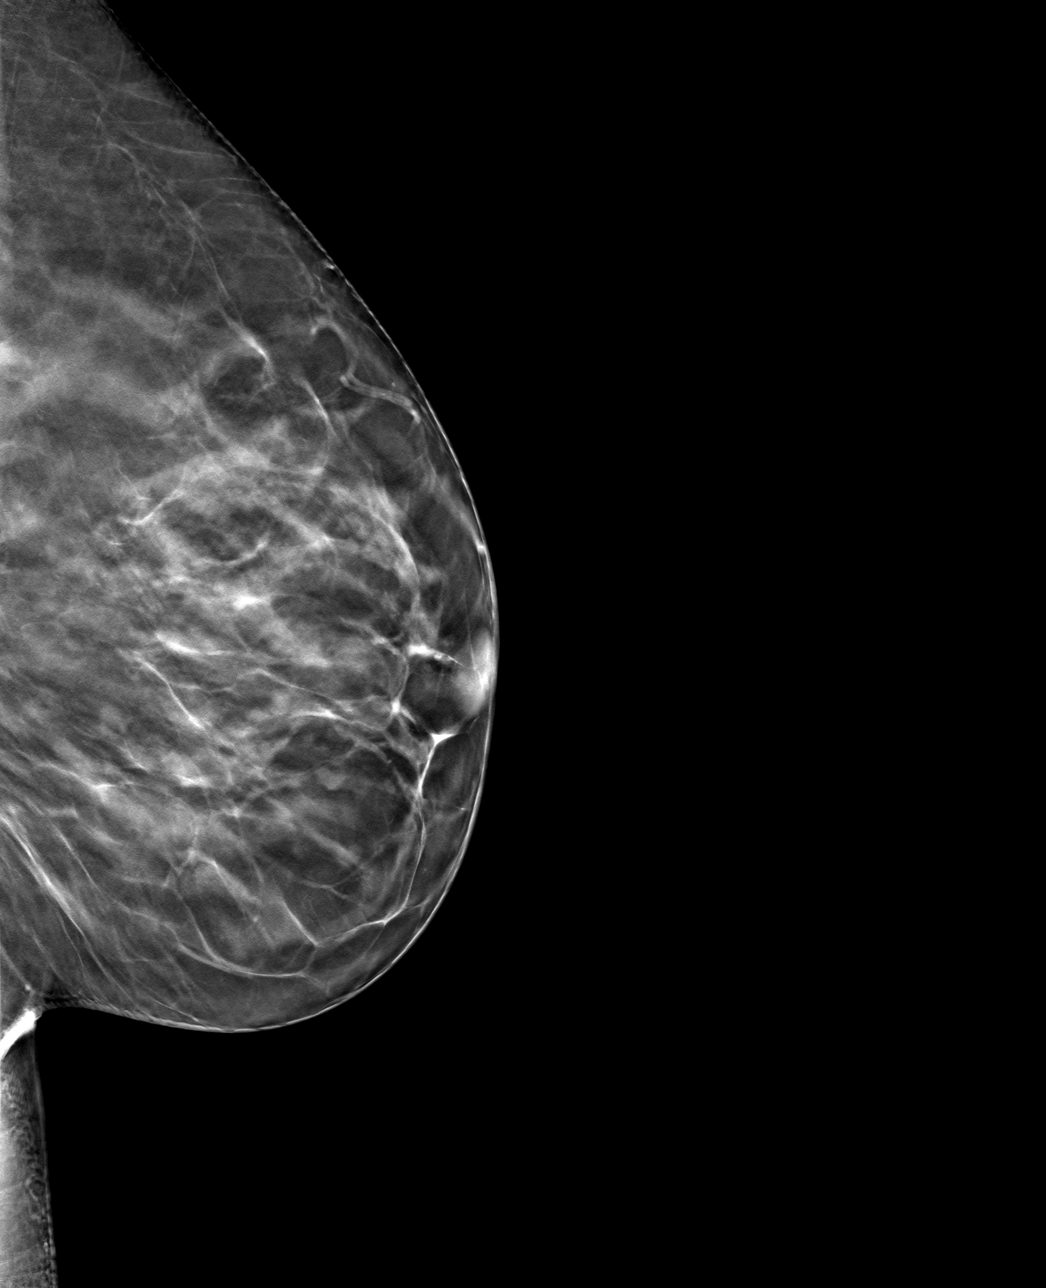

[4 of 12 positions shown; findings below may reference images not displayed]

ACR Breast Density Category c: The breast tissue is heterogeneously
dense, which may obscure small masses.
FINDINGS: 2D/3D full field and spot compression views of the LEFT breast
demonstrate no persistent suspicious abnormality at the site of the
screening study finding, with this area today having a similar
appearance to remote studies.

Mammographic images were processed with CAD.
IMPRESSION: No persistent suspicious abnormality at the site of the screening
study finding.

RECOMMENDATION:
Bilateral screening mammogram in 1 year.

I have discussed the findings and recommendations with the patient.
If applicable, a reminder letter will be sent to the patient
regarding the next appointment.

BI-RADS CATEGORY  1: Negative.

## 2021-05-16 ENCOUNTER — Encounter: Payer: Self-pay | Admitting: Physician Assistant

## 2022-02-01 ENCOUNTER — Ambulatory Visit: Payer: BC Managed Care – PPO | Admitting: Podiatry

## 2022-02-01 ENCOUNTER — Encounter: Payer: Self-pay | Admitting: Podiatry

## 2022-02-01 ENCOUNTER — Ambulatory Visit (INDEPENDENT_AMBULATORY_CARE_PROVIDER_SITE_OTHER): Payer: BC Managed Care – PPO

## 2022-02-01 ENCOUNTER — Other Ambulatory Visit: Payer: Self-pay | Admitting: Podiatry

## 2022-02-01 DIAGNOSIS — M778 Other enthesopathies, not elsewhere classified: Secondary | ICD-10-CM

## 2022-02-01 DIAGNOSIS — M722 Plantar fascial fibromatosis: Secondary | ICD-10-CM | POA: Diagnosis not present

## 2022-02-01 MED ORDER — TRIAMCINOLONE ACETONIDE 40 MG/ML IJ SUSP
40.0000 mg | Freq: Once | INTRAMUSCULAR | Status: AC
  Administered 2022-02-01: 40 mg

## 2022-02-01 MED ORDER — METHYLPREDNISOLONE 4 MG PO TBPK: ORAL_TABLET | ORAL | 0 refills | Status: AC

## 2022-02-01 MED ORDER — MELOXICAM 15 MG PO TABS: 15.0000 mg | ORAL_TABLET | Freq: Every day | ORAL | 3 refills | Status: AC

## 2022-02-01 NOTE — Patient Instructions (Signed)

## 2022-02-01 NOTE — Progress Notes (Signed)
  Subjective:  Patient ID: Ann Cobb, female    DOB: 1965-09-29,  MRN: [id number] HPI Chief Complaint  Patient presents with   Foot Pain    Plantar heel bilateral - aching x few months, AM pain, tried OTC arch band   New Patient (Initial Visit)    56 y.o. female presents with the above complaint.   ROS: Denies fever chills nausea vomit muscle aches pains calf pain back pain chest pain shortness of breath.  No past medical history on file. Past Surgical History:  Procedure Laterality Date   CESAREAN SECTION     DILATION AND CURETTAGE OF UTERUS      Current Outpatient Medications:    meloxicam (MOBIC) 15 MG tablet, Take 1 tablet (15 mg total) by mouth daily., Disp: 30 tablet, Rfl: 3   methylPREDNISolone (MEDROL DOSEPAK) 4 MG TBPK tablet, 6 day dose pack - take as directed, Disp: 21 tablet, Rfl: 0  No Known Allergies Review of Systems Objective:  There were no vitals filed for this visit.  General: Well developed, nourished, in no acute distress, alert and oriented x3   Dermatological: Skin is warm, dry and supple bilateral. Nails x 10 are well maintained; remaining integument appears unremarkable at this time. There are no open sores, no preulcerative lesions, no rash or signs of infection present.  Vascular: Dorsalis Pedis artery and Posterior Tibial artery pedal pulses are 2/4 bilateral with immedate capillary fill time. Pedal hair growth present. No varicosities and no lower extremity edema present bilateral.   Neruologic: Grossly intact via light touch bilateral. Vibratory intact via tuning fork bilateral. Protective threshold with Semmes Wienstein monofilament intact to all pedal sites bilateral. Patellar and Achilles deep tendon reflexes 2+ bilateral. No Babinski or clonus noted bilateral.   Musculoskeletal: No gross boney pedal deformities bilateral. No pain, crepitus, or limitation noted with foot and ankle range of motion bilateral. Muscular strength 5/5 in all  groups tested bilateral.  Pain on palpation medial calcaneal tubercles bilateral.  Gait: Unassisted, Nonantalgic.    Radiographs:  Radiographs taken today demonstrate osseously mature foot mild demineralization of the bone diffusely.  She also demonstrates osteoarthritic changes to the first metatarsophalangeal joint bilateral right greater than left.  Plantar distally oriented calcaneal heel spur left heel soft tissue increase in density at the plantar fascial calcaneal insertion sites bilaterally.  Assessment & Plan:   Assessment: Primary finding his Planter fasciitis bilateral.  Plan: Discussed etiology pathology conservative versus surgical therapies at this point I injected the bilateral heels 20 mg Kenalog 5 mg Marcaine point maximal tenderness.  Placed her on methylprednisolone to be followed by meloxicam.  Provided her with 2 plantar fascia braces discussed appropriate shoe gear will follow-up with her in 4 to 6 weeks.     Ann Cobb, [place]

## 2022-03-01 ENCOUNTER — Ambulatory Visit: Payer: BC Managed Care – PPO | Admitting: Podiatry

## 2022-08-29 ENCOUNTER — Ambulatory Visit: Payer: BC Managed Care – PPO | Admitting: Podiatry
